# Patient Record
Sex: Female | Born: 1970 | Race: White | Hispanic: No | Marital: Married | Smoking: Never smoker
Health system: Southern US, Community
[De-identification: ages and names within clinical notes are randomized; demographics above are authoritative.]

## PROBLEM LIST (undated history)

## (undated) DIAGNOSIS — E079 Disorder of thyroid, unspecified: Secondary | ICD-10-CM

## (undated) DIAGNOSIS — F329 Major depressive disorder, single episode, unspecified: Secondary | ICD-10-CM

## (undated) DIAGNOSIS — G43909 Migraine, unspecified, not intractable, without status migrainosus: Secondary | ICD-10-CM

## (undated) DIAGNOSIS — K219 Gastro-esophageal reflux disease without esophagitis: Secondary | ICD-10-CM

## (undated) DIAGNOSIS — L219 Seborrheic dermatitis, unspecified: Secondary | ICD-10-CM

## (undated) DIAGNOSIS — F32A Depression, unspecified: Secondary | ICD-10-CM

## (undated) DIAGNOSIS — E785 Hyperlipidemia, unspecified: Secondary | ICD-10-CM

## (undated) DIAGNOSIS — T7840XA Allergy, unspecified, initial encounter: Secondary | ICD-10-CM

## (undated) DIAGNOSIS — F419 Anxiety disorder, unspecified: Secondary | ICD-10-CM

## (undated) DIAGNOSIS — M1990 Unspecified osteoarthritis, unspecified site: Secondary | ICD-10-CM

## (undated) HISTORY — DX: Allergy, unspecified, initial encounter: T78.40XA

## (undated) HISTORY — DX: Anxiety disorder, unspecified: F41.9

## (undated) HISTORY — DX: Unspecified osteoarthritis, unspecified site: M19.90

## (undated) HISTORY — DX: Depression, unspecified: F32.A

## (undated) HISTORY — DX: Hyperlipidemia, unspecified: E78.5

## (undated) HISTORY — DX: Disorder of thyroid, unspecified: E07.9

## (undated) HISTORY — DX: Gastro-esophageal reflux disease without esophagitis: K21.9

## (undated) HISTORY — DX: Major depressive disorder, single episode, unspecified: F32.9

## (undated) HISTORY — DX: Seborrheic dermatitis, unspecified: L21.9

## (undated) HISTORY — DX: Migraine, unspecified, not intractable, without status migrainosus: G43.909

---

## 1988-02-12 HISTORY — PX: WISDOM TOOTH EXTRACTION: SHX21

## 1999-03-29 ENCOUNTER — Other Ambulatory Visit: Admission: RE | Admit: 1999-03-29 | Discharge: 1999-03-29 | Payer: Self-pay | Admitting: Obstetrics and Gynecology

## 1999-08-02 ENCOUNTER — Ambulatory Visit (HOSPITAL_COMMUNITY): Admission: RE | Admit: 1999-08-02 | Discharge: 1999-08-02 | Payer: Self-pay | Admitting: Obstetrics and Gynecology

## 1999-10-31 ENCOUNTER — Ambulatory Visit (HOSPITAL_COMMUNITY): Admission: RE | Admit: 1999-10-31 | Discharge: 1999-10-31 | Payer: Self-pay | Admitting: Obstetrics and Gynecology

## 1999-10-31 ENCOUNTER — Encounter: Payer: Self-pay | Admitting: Obstetrics and Gynecology

## 1999-11-05 ENCOUNTER — Inpatient Hospital Stay (HOSPITAL_COMMUNITY): Admission: AD | Admit: 1999-11-05 | Discharge: 1999-11-08 | Payer: Self-pay | Admitting: Obstetrics and Gynecology

## 1999-11-09 ENCOUNTER — Encounter: Admission: RE | Admit: 1999-11-09 | Discharge: 1999-12-25 | Payer: Self-pay | Admitting: Obstetrics and Gynecology

## 1999-12-13 ENCOUNTER — Other Ambulatory Visit: Admission: RE | Admit: 1999-12-13 | Discharge: 1999-12-13 | Payer: Self-pay | Admitting: Obstetrics and Gynecology

## 2001-02-09 ENCOUNTER — Other Ambulatory Visit: Admission: RE | Admit: 2001-02-09 | Discharge: 2001-02-09 | Payer: Self-pay | Admitting: Internal Medicine

## 2001-02-27 ENCOUNTER — Other Ambulatory Visit: Admission: RE | Admit: 2001-02-27 | Discharge: 2001-02-27 | Payer: Self-pay | Admitting: Internal Medicine

## 2001-03-03 ENCOUNTER — Other Ambulatory Visit: Admission: RE | Admit: 2001-03-03 | Discharge: 2001-03-03 | Payer: Self-pay | Admitting: Obstetrics and Gynecology

## 2001-09-29 ENCOUNTER — Other Ambulatory Visit: Admission: RE | Admit: 2001-09-29 | Discharge: 2001-09-29 | Payer: Self-pay | Admitting: Obstetrics and Gynecology

## 2001-12-08 ENCOUNTER — Other Ambulatory Visit: Admission: RE | Admit: 2001-12-08 | Discharge: 2001-12-08 | Payer: Self-pay | Admitting: Obstetrics and Gynecology

## 2002-03-30 ENCOUNTER — Ambulatory Visit (HOSPITAL_COMMUNITY): Admission: RE | Admit: 2002-03-30 | Discharge: 2002-03-30 | Payer: Self-pay | Admitting: Obstetrics and Gynecology

## 2002-03-30 ENCOUNTER — Other Ambulatory Visit: Admission: RE | Admit: 2002-03-30 | Discharge: 2002-03-30 | Payer: Self-pay | Admitting: Obstetrics and Gynecology

## 2002-06-17 ENCOUNTER — Inpatient Hospital Stay (HOSPITAL_COMMUNITY): Admission: AD | Admit: 2002-06-17 | Discharge: 2002-06-20 | Payer: Self-pay | Admitting: Obstetrics and Gynecology

## 2002-07-29 ENCOUNTER — Other Ambulatory Visit: Admission: RE | Admit: 2002-07-29 | Discharge: 2002-07-29 | Payer: Self-pay | Admitting: Obstetrics and Gynecology

## 2003-02-21 ENCOUNTER — Other Ambulatory Visit: Admission: RE | Admit: 2003-02-21 | Discharge: 2003-02-21 | Payer: Self-pay | Admitting: Obstetrics and Gynecology

## 2003-08-23 ENCOUNTER — Other Ambulatory Visit: Admission: RE | Admit: 2003-08-23 | Discharge: 2003-08-23 | Payer: Self-pay | Admitting: Obstetrics and Gynecology

## 2004-03-09 ENCOUNTER — Encounter: Admission: RE | Admit: 2004-03-09 | Discharge: 2004-03-09 | Payer: Self-pay | Admitting: Internal Medicine

## 2004-09-19 ENCOUNTER — Other Ambulatory Visit: Admission: RE | Admit: 2004-09-19 | Discharge: 2004-09-19 | Payer: Self-pay | Admitting: Obstetrics and Gynecology

## 2005-02-11 HISTORY — PX: ORIF PATELLA FRACTURE: SUR947

## 2006-02-11 HISTORY — PX: ORIF PATELLA FRACTURE: SUR947

## 2006-03-04 ENCOUNTER — Emergency Department (HOSPITAL_COMMUNITY): Admission: EM | Admit: 2006-03-04 | Discharge: 2006-03-04 | Payer: Self-pay | Admitting: Emergency Medicine

## 2006-03-06 ENCOUNTER — Inpatient Hospital Stay (HOSPITAL_COMMUNITY): Admission: AD | Admit: 2006-03-06 | Discharge: 2006-03-07 | Payer: Self-pay | Admitting: Orthopedic Surgery

## 2006-04-25 ENCOUNTER — Encounter: Admission: RE | Admit: 2006-04-25 | Discharge: 2006-07-24 | Payer: Self-pay | Admitting: Orthopedic Surgery

## 2006-10-01 ENCOUNTER — Ambulatory Visit (HOSPITAL_BASED_OUTPATIENT_CLINIC_OR_DEPARTMENT_OTHER): Admission: RE | Admit: 2006-10-01 | Discharge: 2006-10-01 | Payer: Self-pay | Admitting: Orthopedic Surgery

## 2006-12-11 ENCOUNTER — Ambulatory Visit: Payer: Self-pay | Admitting: Endocrinology

## 2006-12-11 ENCOUNTER — Encounter: Payer: Self-pay | Admitting: Endocrinology

## 2006-12-11 DIAGNOSIS — E039 Hypothyroidism, unspecified: Secondary | ICD-10-CM | POA: Insufficient documentation

## 2006-12-11 DIAGNOSIS — E785 Hyperlipidemia, unspecified: Secondary | ICD-10-CM | POA: Insufficient documentation

## 2006-12-11 DIAGNOSIS — R51 Headache: Secondary | ICD-10-CM | POA: Insufficient documentation

## 2006-12-11 DIAGNOSIS — R519 Headache, unspecified: Secondary | ICD-10-CM | POA: Insufficient documentation

## 2006-12-12 ENCOUNTER — Telehealth: Payer: Self-pay | Admitting: Endocrinology

## 2006-12-18 ENCOUNTER — Encounter: Admission: RE | Admit: 2006-12-18 | Discharge: 2006-12-18 | Payer: Self-pay | Admitting: Endocrinology

## 2007-01-12 ENCOUNTER — Encounter: Payer: Self-pay | Admitting: Endocrinology

## 2007-02-26 ENCOUNTER — Ambulatory Visit: Payer: Self-pay | Admitting: Endocrinology

## 2007-03-05 LAB — CONVERTED CEMR LAB: TSH: 2.17 microintl units/mL (ref 0.35–5.50)

## 2007-12-14 ENCOUNTER — Telehealth (INDEPENDENT_AMBULATORY_CARE_PROVIDER_SITE_OTHER): Payer: Self-pay | Admitting: *Deleted

## 2007-12-29 ENCOUNTER — Ambulatory Visit: Payer: Self-pay | Admitting: Endocrinology

## 2007-12-29 DIAGNOSIS — E079 Disorder of thyroid, unspecified: Secondary | ICD-10-CM | POA: Insufficient documentation

## 2007-12-29 LAB — CONVERTED CEMR LAB: TSH: 3.34 microintl units/mL (ref 0.35–5.50)

## 2008-01-05 ENCOUNTER — Ambulatory Visit (HOSPITAL_COMMUNITY): Admission: RE | Admit: 2008-01-05 | Discharge: 2008-01-05 | Payer: Self-pay | Admitting: Endocrinology

## 2008-01-11 ENCOUNTER — Ambulatory Visit: Payer: Self-pay | Admitting: Internal Medicine

## 2008-12-12 LAB — CONVERTED CEMR LAB

## 2009-01-16 ENCOUNTER — Ambulatory Visit: Payer: Self-pay | Admitting: Internal Medicine

## 2009-01-16 ENCOUNTER — Encounter: Payer: Self-pay | Admitting: Endocrinology

## 2009-01-27 ENCOUNTER — Ambulatory Visit: Payer: Self-pay | Admitting: Endocrinology

## 2009-02-16 ENCOUNTER — Ambulatory Visit: Payer: Self-pay | Admitting: Internal Medicine

## 2009-08-11 ENCOUNTER — Ambulatory Visit: Payer: Self-pay | Admitting: Internal Medicine

## 2010-03-04 ENCOUNTER — Encounter: Payer: Self-pay | Admitting: Endocrinology

## 2010-04-02 ENCOUNTER — Encounter (INDEPENDENT_AMBULATORY_CARE_PROVIDER_SITE_OTHER): Payer: Self-pay | Admitting: Internal Medicine

## 2010-04-02 DIAGNOSIS — L219 Seborrheic dermatitis, unspecified: Secondary | ICD-10-CM

## 2010-04-02 DIAGNOSIS — E039 Hypothyroidism, unspecified: Secondary | ICD-10-CM

## 2010-04-02 DIAGNOSIS — G43909 Migraine, unspecified, not intractable, without status migrainosus: Secondary | ICD-10-CM

## 2010-05-30 ENCOUNTER — Other Ambulatory Visit: Payer: Self-pay | Admitting: Endocrinology

## 2010-06-26 NOTE — Op Note (Signed)
NAMEMADESYN, AST              ACCOUNT NO.:  [id number]   MEDICAL RECORD NO.:  [id number]          PATIENT TYPE:  AMB   LOCATION:  [facility]                         FACILITY:  [facility]   PHYSICIAN:  Ollen Gross, M.D.    DATE OF BIRTH:  10/29/1970   DATE OF PROCEDURE:  10/01/2006  DATE OF DISCHARGE:                               OPERATIVE REPORT   PREOPERATIVE DIAGNOSIS:  Painful hardware right patella.   POSTOPERATIVE DIAGNOSIS:  Painful hardware right patella.   PROCEDURE:  Hardware removal right patella.   SURGEON:  Ollen Gross, M.D.   ASSISTANT:  None.   ANESTHESIA:  General.   ESTIMATED BLOOD LOSS:  Minimal.   DRAINS:  None.   TOURNIQUET TIME:  11 minutes at 300 mmHg.   COMPLICATIONS:  None.   CONDITION:  Stable to recovery.   BRIEF CLINICAL NOTE:  [name] is a 40 year old female who underwent open  reduction and internal fixation of a left patellar fracture  approximately eight months ago.  She has had pain from the hardware and  the fracture has healed uneventfully.  She presents now for hardware  removal.   PROCEDURE IN DETAIL:  After successful administration of general  anesthetic, a tourniquet was placed high on the left thigh.  The left  lower extremity was prepped and draped in the usual sterile fashion.  The extremity was elevated and tourniquet inflated to 300 mmHg.  I  excised her previous scar and got down to the patella.  The two K-wires  were identified and removed.  I then identified the cerclage wire,  clipped it with a wire cutter, and removed it with pliers.  This  represented all of the hardware that had been in place and this was all  removed.  The wound was then copiously irrigated with saline solution  and tourniquet released for a total time of 11 minutes.  Minor bleeding  stopped with cautery.  Subcu was closed with interrupted 2-0 Vicryl and  subcuticular running 4-0 Monocryl.  The incision was cleaned and dried  and Steri-Strips and a  bulky sterile dressing applied.  Ice packs were  placed and she is awakened and transferred to recovery in stable  condition.      Ollen Gross, M.D.  Electronically Signed     FA/MEDQ  D:  10/01/2006  T:  10/02/2006  Job:  [id number]

## 2010-06-29 ENCOUNTER — Other Ambulatory Visit: Payer: Self-pay | Admitting: Endocrinology

## 2010-06-29 NOTE — Op Note (Signed)
NAME:  Natalie Kelly, Natalie Kelly                        ACCOUNT NO.:  [id number]   MEDICAL RECORD NO.:  [id number]                   PATIENT TYPE:  INP   LOCATION:  9114                                 FACILITY:  [facility]   PHYSICIAN:  Juluis Mire, M.D.                DATE OF BIRTH:  24-Jan-1971   DATE OF PROCEDURE:  06/17/2002  DATE OF DISCHARGE:                                 OPERATIVE REPORT   PREOPERATIVE DIAGNOSIS:  Intrauterine pregnancy at term with prior cesarean  section, desires repeat.   POSTOPERATIVE DIAGNOSIS:  Intrauterine pregnancy at term with prior cesarean  section, desires repeat.   PROCEDURE:  Low transverse cesarean section.   SURGEON:  Juluis Mire, M.D.   ANESTHESIA:  Spinal.   ESTIMATED BLOOD LOSS:  800 mL.   PACKS AND DRAINS:  None.   BLOOD REPLACED:  None.   COMPLICATIONS:  None.   INDICATIONS:  Dictated in the history and physical.   DESCRIPTION OF PROCEDURE:  The patient was taken to the OR and placed in the  supine position with left lateral tilt.  After satisfactory level of spinal  anesthesia was obtained, the abdomen was prepped out with Betadine and  draped in a sterile field.  A prior low transverse skin incision was then  excised, incision went through skin and subcutaneous tissue.  The fascia was  entered sharply and the incision in the fascia extended laterally.  The  fascia taken off the muscle superiorly and inferiorly.  The rectus muscles  were separated in the midline.  The peritoneum was entered sharply and the  incision in the peritoneum extended superiorly and inferiorly.  A low  transverse bladder flap was developed.  A low transverse uterine incision  developed with a knife, extended laterally using manual traction.  The  infant was in the vertex position, delivered with _______ gentle fundal  pressure.  The infant was a viable female who weighed 9 pounds 1 ounce, Apgars  were 9/9.  Umbilical artery pH was 7.30.  The placenta was then  delivered  manually.  The uterus wiped clean of remaining membranes and placenta.  The  uterus was then closed with running locking suture of 0 chromic using a two-  layer closure technique.  We had good hemostasis.  Tubes and ovaries were  noted to be unremarkable.  Urine output remained clear and adequate.  At  this point in time muscles reapproximated in a running suture of 3-0 Vicryl.  Fascia closed with a running suture of 0 PDS.  Skin was closed with staples  and Steri-Strips.  Sponge, instrument, and needle count reported as correct  by the circulating nurse x2.  The Foley catheter remained clear at the time  of closure.  The patient tolerated the procedure well and was returned to  the recovery room in good condition.  Juluis Mire, M.D.   JSM/MEDQ  D:  06/17/2002  T:  06/18/2002  Job:  [id number]

## 2010-06-29 NOTE — Op Note (Signed)
Natalie Kelly, Natalie Kelly              ACCOUNT NO.:  [id number]   MEDICAL RECORD NO.:  [id number]          PATIENT TYPE:  OIB   LOCATION:  0098                         FACILITY:  [facility]   PHYSICIAN:  Ollen Gross, M.D.    DATE OF BIRTH:  01-23-1971   DATE OF PROCEDURE:  03/05/2006  DATE OF DISCHARGE:                               OPERATIVE REPORT   PREOPERATIVE DIAGNOSIS:  Right patella fracture.   POSTOPERATIVE DIAGNOSIS:  Right patella fracture.   PROCEDURE:  Open reduction internal fixation right patella fracture.   SURGEON:  Dr. Homero Fellers Aluisio.   ASSISTANT:  Avel Peace, PA-C.   ANESTHESIA:  General.   ESTIMATED BLOOD LOSS:  Minimal.   DRAINS:  None.   TOURNIQUET TIME:  38 minutes at 300 mmHg.   COMPLICATIONS:  None.   CONDITION:  Stable to recovery.   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  She had dysfunctional  extensor mechanism and presents now for open reduction internal  fixation.   PROCEDURE IN DETAIL:  After successful administration of general  anesthetic, a tourniquet was placed high on the right thigh and right  lower extremity prepped and draped in the usual sterile fashion.  Extremities wrapped in Esmarch, tourniquet inflated to 300 mmHg.  A  midline incision was made with a 10 blade through the subcutaneous  tissue.  The superficial retinaculum was incised and elevated medial and  lateral.  The fracture was identified.  The joint is copiously irrigated  with saline solution to remove hematoma.  The superior fragment is one  main fragment while the inferior fragment is two main fragments and some  small comminuted pieces.  The very small comminuted pieces were debrided  out.  The configuration is a transverse fracture with the superior  fragment representing about two-thirds of the patella and the inferior  fragments about one-third of the patella.  We reduced this with towel  clips and then passed the 0.062 K-wire, one from superior to inferior,  the other from inferior to superior in order to capture the main  fragments and this effectively did so.  I then placed a 16-gauge  Angiocath distally along the inferior pole of the patella from lateral  to medial, passed an 18 gauge wire through that and then superiorly did  a similar passing with the Angiocath, put the wire through that in a  figure-of-eight fashion and tightened the wire to effectively compress  the superior to inferior fragment.  This effectively reduced the main  fragments anatomically.  There is still one decent size fragment about 5  mm x 1 cm inferolateral and I reattached that to the main fragment with  #2 FiberWire sutures through drill holes in the main fragment and this  anatomically reduced this with stable fixation. I then flexed the knee  and had flexion against gravity of 135 degrees and nothing was pulling  apart.  Everything remained well reduced.  X-rays were taken in  the AP  and lateral plane showing good position of the hardware and that the  fracture had lined up essentially anatomically with the exception of  some of the missing fragments from the comminution. I then thoroughly  irrigated the knee joint and fixed the tears in the medial and lateral  retinaculum with the FiberWire suture.  We flexed against gravity again  and were able to obtain 135 degrees without anything pulling apart.  We  then cut the wires at the appropriate length and then twisted them into  the soft tissue.  The subcutaneous tissues were then closed with  interrupted 2-0 Vicryl and the tourniquet released with a total time of  38 minutes.  The subcuticular was closed with running 4-0 Monocryl.  We  then placed the catheter for the Marcaine pain pump subcutaneously and  initiated the pump.  Steri-Strips and a bulky sterile dressing were  then  placed and she was awakened and transported to recovery in stable  condition.      Ollen Gross, M.D.  Electronically Signed     FA/MEDQ  D:  03/05/2006  T:  03/05/2006  Job:  [id number]

## 2010-06-29 NOTE — H&P (Signed)
[facility]  Patient:    Natalie Kelly, Natalie Kelly                       MRN: [id number] Adm. Date:  11/05/99 Attending:  Juluis Mire, M.D.                         History and Physical  HISTORY OF PRESENT ILLNESS:   The patient is a 40 year old primigravida, married white female, last menstrual period of December 6, giving her estimated date of confinement of September 11, and estimated gestational age of [redacted] weeks.  Her prenatal course has been complicated by postdates.  An ultrasound had revealed an estimated fetal weight of approximately 10 pounds. Due to large estimated fetal weight and postdates with unfavorable cervix, tpx is admitted for primary cesarean section.  The option of a trial of labor was given to the patient which she declines to the large estimated fetal weight. Dates are consistent with early ultrasounds and initial examination.  Her prenatal course has otherwise been uncomplicated.  Her three-hour glucose tolerance test was 134, her Group B Strep was negative.  ALLERGIES:                    No known drug allergies.  MEDICATIONS:                  Prenatal vitamins.  PAST MEDICAL HISTORY: FAMILY HISTORY: SOCIAL HISTORY:               Please see prenatal records.  REVIEW OF SYSTEMS:            Noncontributory.  PHYSICAL EXAMINATION:  VITAL SIGNS:                  The patient is afebrile with stable vital signs.   HEENT:                        The patient is normocephalic.  Pupils are equal, round and reactive to light and accommodation.  Extraocular muscles were intact.  Sclerae and conjunctivae clear.  Oropharynx clear.  NECK:                         Without thyromegaly.  BREASTS:                      Glandular, but no discrete masses.  LUNGS:                        Clear.  HEART:                        Regular rate and rhythm with a grade 2/6 systolic ejection murmur, no clicks or gallops.  ABDOMEN:                       Gravid uterus consistent with dates.  PELVIC:                       Cervix is long and closed.  EXTREMITIES:                  Trace edema.  NEUROLOGICAL:                 Grossly within normal limits.  IMPRESSION:                   Intrauterine pregnancy at 41 weeks with macrosomic infact.  PLAN:                         The patient was offered the option of trial of labor in the form of induction versus primary cesarean section.  Due to the large estimated fetal weight and unfavorable cervix, the patient decided to proceed with primary cesarean section.  The risks were discussed including the risks of anesthesia, the risks of infection, the risks of hemorrhage that could necessitate transfusion with the risks of AIDS or hepatitis, the risks of injury to adjacent organs including bladder, bowel, or ureters that could require further exploratory surgery, the risks of deep venous thrombosis and pulmonary embolus.  The patient expressed understanding of indications and risks. DD:  11/05/99 TD:  11/05/99 Job: [id number]

## 2010-06-29 NOTE — Discharge Summary (Signed)
[facility]  Patient:    Natalie Kelly, Natalie Kelly                     MRN: [id number] Adm. Date:  11/05/99 Disc. Date: 11/08/99 Attending:  Frederich Balding Dictator:   Danie Chandler, R.N.                           Discharge Summary  ADMISSION DIAGNOSIS:          Intrauterine pregnancy at term with macrosomic infant.  DISCHARGE DIAGNOSIS:          Intrauterine pregnancy at term with macrosomic infant.  PROCEDURE:                    On November 05, 1999, a primary low transverse cesarean section was performed.  REASON FOR ADMISSION:         Please see dictated history and physical.  HOSPITAL COURSE:              The patient was taken to the operating room and underwent the above noted procedure without complications.  This was productive of a viable female infant with Apgars of 8 at one minute and 9 at five minutes.  Postoperatively, the patient did well.  On postoperative day #1, the patients hemoglobin was 9.4, hematocrit 27.9 and white blood cell count 15.6.  The patient had good control of pain on this day.  She was started on iron daily and had a repeat CBC ordered for the following day.  On postoperatively day #2, the patient was ambulating well without difficulty and had good return of bowel function.  She was tolerating a regular diet.  Her hemoglobin was stable at 9.6.  She was discharged to home on postoperative day #3.  CONDITION ON DISCHARGE:       Good.  DIET:                         Regular as tolerated.  ACTIVITY:                     No heavy lifting, no driving, no vaginal entry.  FOLLOW-UP:                    She is to follow up in the office in one to two weeks for incision check and she is to call for temperature greater than 100 degrees, persistent nausea or vomiting, heavy vaginal bleeding and/or redness or drainage from the incision site.  DISCHARGE MEDICATIONS:        1. Prenatal vitamin one p.o. q.d.               2. Percocet one to two p.o. q.4h. p.r.n. pain. DD:  11/08/99 TD:  11/08/99 Job: [id number]

## 2010-06-29 NOTE — Discharge Summary (Signed)
NAME:  Natalie Kelly, Natalie Kelly                        ACCOUNT NO.:  [id number]   MEDICAL RECORD NO.:  [id number]                   PATIENT TYPE:  INP   LOCATION:  9114                                 FACILITY:  [facility]   PHYSICIAN:  Dineen Kid. Rana Snare, M.D.                 DATE OF BIRTH:  [birth year]   DATE OF ADMISSION:  06/17/2002  DATE OF DISCHARGE:  06/20/2002                                 DISCHARGE SUMMARY   ADMISSION DIAGNOSES:  1. Intrauterine pregnancy at term.  2. Previous cesarean delivery, desires repeat.   DISCHARGE DIAGNOSES:  1. Status post low transverse cesarean section.  2. Viable female infant.   PROCEDURE:  Repeat low transverse cesarean section.   REASON FOR ADMISSION:  Please see dictated H&P.   HOSPITAL COURSE:  The patient was a 40 year old gravida 2, para 1 that was  admitted to hospital at 81 weeks estimated gestational age for scheduled  cesarean delivery.  The patient had had a previous cesarean delivery for a  large for gestational age baby and she desired repeat  cesarean delivery.  Her pregnancy had been otherwise uncomplicated with a normal three-hour  glucose tolerance test.  On the a.m. of admission, the patient was prepped  accordingly and taken to the operating room where spinal anesthesia was  administered without difficulty.  A low transverse incision was made with  delivery of a viable female infant weighing 9 pounds 1 ounce with Apgars of 9  at one minute and 9 at five minutes.  Umbilical cord pH was 7.30.  The  patient tolerated procedure well and was taken to the recovery room in  stable condition.   On postoperative day #1, the patient had good return of bowel function.  Vital signs were stable.  The patient was afebrile.  Fundus was firm and  nontender.  Abdominal dressing was clean, dry and intact.   Labs reveal hemoglobin 9.5, platelet count 246,000, WBC 14.5.  RhoGAM  studies were pending.  Diet was advanced and abdominal  dressing was   removed.   On postoperative #2, vital signs were stable.  Abdomen was soft and  nontender.  Fundus was firm and incision was clean, dry and intact.  The  patient was ambulating well and tolerating a regular diet without complaint  of nausea and vomiting.   On postoperative day #3, the patient was doing well.  Vital signs were  stable.  She remained afebrile.  Fundus was firm and nontender.  Incision  was clean, dry and intact.  Staples removed and the patient was discharged  home.   CONDITION ON DISCHARGE:  Good.   DIET:  Regular as tolerated.   ACTIVITY:  No heavy lifting.  No driving x2 weeks. No vaginal entry.   FOLLOW UP:  The patient is to follow up in the office in one to two weeks  for an incision check.  She is to  call for temperature greater than 100  degrees, persistent nausea and vomiting, heavy vaginal bleeding, any redness  or drainage in incisional site.   DISCHARGE MEDICATIONS:  1. Tylox #30 one p.o. every four to six hours p.r.n.  2. Motrin 600 mg every six hours p.r.n.  3. Prenatal vitamins one p.o. daily.  4. Colace one p.o. daily p.r.n.       Julio Sicks, N.P.                        Dineen Kid Rana Snare, M.D.    CC/MEDQ  D:  07/20/2002  T:  07/20/2002  Job:  [id number]

## 2010-06-29 NOTE — H&P (Signed)
NAME:  Natalie Kelly, Natalie Kelly                        ACCOUNT NO.:  [id number]   MEDICAL RECORD NO.:  [id number]                   PATIENT TYPE:  INP   LOCATION:  NA                                   FACILITY:  [facility]   PHYSICIAN:  Juluis Mire, M.D.                DATE OF BIRTH:  1970-05-26   DATE OF ADMISSION:  DATE OF DISCHARGE:                                HISTORY & PHYSICAL   HISTORY OF PRESENT ILLNESS:  Patient is a 40 year old gravida 2, para 1,  married female, last menstrual period of September 16, 2001, giving an estimated  date of confinement of Jun 23, 2002; this gives her an estimated gestational  age of [redacted] weeks, consistent with initial exam and prior ultrasound.  Patient  presents for repeat C-section.   Prenatal course was complicated by a prior low-transverse cesarean section  due to large estimated fetal weight.  The option of a trial labor had been  discussed but declined by the patient.  The patient now presents for repeat  cesarean section.  Her prenatal course has otherwise been uncomplicated.   PRENATAL LABORATORIES:  She is O negative with a negative antibody screen,  nonreactive serology, positive Rubella titer.  Her hepatitis B surface  antigen was negative.  Her quad screen was also negative.  Group B strep was  negative.  Her 50-gram Glucola was elevated; however, a three-hour glucose  tolerance test was within normal limits.   PAST MEDICAL HISTORY, FAMILY HISTORY, AND SOCIAL HISTORY:  Please see  prenatal records.   REVIEW OF SYSTEMS:  Noncontributory.   PHYSICAL EXAMINATION:  VITAL SIGNS:  Patient is afebrile with stable vital  signs.  HEENT EXAM:  Patient is normocephalic.  Pupils are equal, round, and  reactive to light and accommodation.  Extraocular movements are intact.  Sclerae and conjunctivae are clear.  Oropharynx is clear.  NECK:  Without thyromegaly.  BREASTS:  Glandular, but no discrete masses.  LUNGS:  Clear.  CARDIAC SYSTEM:  Regular rhythm  and rate with a grade 2/6 systolic ejection  murmur.  No clicks or gallops.  ABDOMINAL EXAM:  Revealed gravid uterus consistent with dates.  PELVIC EXAM:  Cervix is long and closed.  Vertex presenting.  EXTREMITIES:  Trace edema.  NEUROLOGICAL:  Deep tendon reflexes 2+ and no clonus.   IMPRESSION:  Intrauterine pregnancy at 39 weeks with prior cesarean section,  desires repeat.   PLAN:  The patient is to undergo a repeat cesarean section.  The risks of  surgery have been discussed, including the risk of infection, the risk of  hemorrhage that could necessitate transfusion with the risk of AIDS or  hepatitis, the risk of injury to adjacent organs, including bladder, bowel,  or ureters that could require further exploratory surgery, the risk of deep  venous thrombosis and pulmonary embolus.  Patient expressed understanding of  indications and risks.  Juluis Mire, M.D.    JSM/MEDQ  D:  06/17/2002  T:  06/17/2002  Job:  [id number]

## 2010-06-29 NOTE — Op Note (Signed)
[facility]  Patient:    Natalie Kelly, Natalie Kelly                     MRN: [id number] Proc. Date: 11/05/99 Attending:  Frederich Balding                           Operative Report  PREOPERATIVE DIAGNOSIS:       Intrauterine pregnancy at term with macrosomic                               infant.  POSTOPERATIVE DIAGNOSIS:      Intrauterine pregnancy at term with macrosomic                               infant.  OPERATION:                    Low transverse cesarean section.  SURGEON:                      Juluis Mire, M.D.  ANESTHESIA:                   Spinal anesthesia.  ESTIMATED BLOOD LOSS:         800 cc.  PACKS AND DRAINS:             None.  INTRAOPERATIVE BLOOD:         None.  COMPLICATIONS:                None.  INDICATIONS:                  This is dictated in the history and physical.  DESCRIPTION OF PROCEDURE:     The patient was taken to the OR and placed in the supine position with left lateral tilt. After a satisfactory level of spinal anesthesia was obtained, the abdomen was prepped out with Betadine and draped as a sterile field.  A low transverse skin incision was made with the knife and carried through subcutaneous tissue.  The anterior rectus fascia was entered sharply and extended into the fascia laterally.  The fascia was taken off the muscle superiorly and inferiorly.  Rectus muscles then separated in the midline.  The peritoneum was then entered sharply and the incision in the peritoneum was extended both superiorly and inferiorly.  Low transverse bladder flap was developed. A low transverse uterine incision was begun with the knife and extended laterally using manual traction.  The infant presented in vertex presentation, delivered with elevation of the head and fundal pressure.  The infant was a viable female who weighed 9 pounds 12 ounces.  Apgars were 8 and 9.  Umbilicus cord pH was 7.31.  Placenta was then delivered  manually.  Uterus wiped free of remaining membranes and placenta. The uterus was closed with running locking sutures of 0 chromic using a two-layer closure technique with good hemostasis.  Urine output remained clear and adequate. Tubes and ovaries were visualized and noted to be unremarkable. Muscles were reapproximated with running sutures of 3-0 Vicryl. Fascia closed with running sutures of 0 PDS.  Skin was closed with staples and Steri-Strips. Sponge, needle and instrument counts reported as correct by the circulating nurse x 2.  The Foley catheter remained clear at  the time of closure.  The patient tolerated the procedure well and was returned to the recovery room in good condition. DD:  11/05/99 TD:  11/06/99 Job: [id number]

## 2010-11-23 LAB — POCT PREGNANCY, URINE: Operator id: 268271

## 2010-11-23 LAB — POCT HEMOGLOBIN-HEMACUE: Hemoglobin: 13.3

## 2011-01-02 ENCOUNTER — Other Ambulatory Visit: Payer: Self-pay | Admitting: Internal Medicine

## 2011-01-02 NOTE — Telephone Encounter (Signed)
See when pt due for OV please.Have refilled Rx. MJB

## 2011-01-31 ENCOUNTER — Other Ambulatory Visit: Payer: Self-pay | Admitting: Internal Medicine

## 2011-01-31 MED ORDER — LEVOTHYROXINE SODIUM 50 MCG PO TABS: 50.0000 ug | ORAL_TABLET | Freq: Every day | ORAL | Status: DC

## 2011-03-11 ENCOUNTER — Encounter: Payer: Commercial Managed Care - PPO | Admitting: Internal Medicine

## 2011-04-05 ENCOUNTER — Other Ambulatory Visit: Payer: Commercial Managed Care - PPO | Admitting: Internal Medicine

## 2011-04-05 DIAGNOSIS — Z0000 Encounter for general adult medical examination without abnormal findings: Secondary | ICD-10-CM

## 2011-04-05 LAB — COMPREHENSIVE METABOLIC PANEL
ALT: 9 U/L (ref 0–35)
Albumin: 4.6 g/dL (ref 3.5–5.2)
BUN: 13 mg/dL (ref 6–23)
CO2: 19 mEq/L (ref 19–32)
Creat: 0.84 mg/dL (ref 0.50–1.10)
Total Bilirubin: 0.5 mg/dL (ref 0.3–1.2)
Total Protein: 7.5 g/dL (ref 6.0–8.3)

## 2011-04-05 LAB — LIPID PANEL
Cholesterol: 208 mg/dL — ABNORMAL HIGH (ref 0–200)
LDL Cholesterol: 131 mg/dL — ABNORMAL HIGH (ref 0–99)
Triglycerides: 124 mg/dL (ref <150)

## 2011-04-06 LAB — TSH: TSH: 4.68 u[IU]/mL — ABNORMAL HIGH (ref 0.350–4.500)

## 2011-04-06 LAB — CBC WITH DIFFERENTIAL/PLATELET
Basophils Absolute: 0.1 10*3/uL (ref 0.0–0.1)
Basophils Relative: 1 % (ref 0–1)
Eosinophils Absolute: 0.1 10*3/uL (ref 0.0–0.7)
HCT: 42.5 % (ref 36.0–46.0)
Lymphs Abs: 3.2 10*3/uL (ref 0.7–4.0)
MCHC: 32.5 g/dL (ref 30.0–36.0)
MCV: 87.8 fL (ref 78.0–100.0)
Platelets: 354 10*3/uL (ref 150–400)
RBC: 4.84 MIL/uL (ref 3.87–5.11)
RDW: 13.3 % (ref 11.5–15.5)
WBC: 9.8 10*3/uL (ref 4.0–10.5)

## 2011-04-07 ENCOUNTER — Other Ambulatory Visit: Payer: Self-pay | Admitting: Internal Medicine

## 2011-04-08 ENCOUNTER — Encounter: Payer: Self-pay | Admitting: Internal Medicine

## 2011-04-08 ENCOUNTER — Ambulatory Visit (INDEPENDENT_AMBULATORY_CARE_PROVIDER_SITE_OTHER): Payer: 59 | Admitting: Internal Medicine

## 2011-04-08 VITALS — BP 120/78 | HR 76 | Temp 98.9°F | Ht 63.5 in | Wt 153.5 lb

## 2011-04-08 DIAGNOSIS — Z0000 Encounter for general adult medical examination without abnormal findings: Secondary | ICD-10-CM

## 2011-04-08 DIAGNOSIS — G43909 Migraine, unspecified, not intractable, without status migrainosus: Secondary | ICD-10-CM | POA: Insufficient documentation

## 2011-04-08 DIAGNOSIS — E079 Disorder of thyroid, unspecified: Secondary | ICD-10-CM | POA: Insufficient documentation

## 2011-04-08 LAB — POCT URINALYSIS DIPSTICK
Bilirubin, UA: NEGATIVE
Ketones, UA: NEGATIVE
Leukocytes, UA: NEGATIVE
Nitrite, UA: NEGATIVE
Spec Grav, UA: >=1.03
Urobilinogen, UA: NEGATIVE

## 2011-04-09 ENCOUNTER — Encounter: Payer: Self-pay | Admitting: Internal Medicine

## 2011-04-09 NOTE — Patient Instructions (Addendum)
Increase Synthroid to 0.075 mg daily. Watch diet and try to get some exercise. Return for TSH in about a weeks.

## 2011-04-09 NOTE — Progress Notes (Signed)
  Subjective:    Patient ID: Natalie Kelly, female    DOB: [birth year], 41 y.o.   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 Patient had influenza immunization at work October 2012. For migraine headaches takes Topamax 50 mg daily for prophylaxis, uses Imitrex nasal spray 20 mg as needed for migraine and for rescue medication uses Fioricet if necessary. Hyperlipidemia was diagnosed in 2010. She tries to control it with diet. Dr. Arelia Sneddon is GYN physician. History of fracture right patella 2008. No known drug allergies. Currently on Synthroid 0.05 mg with compliance but TSH is elevated so were going to increase Synthroid to 0.075 mg daily and recheck in 6-8 weeks. Back in 2011 I tried to get her to take Zocor for hyperlipidemia but she did not want to do that.  Patient does not smoke or consume alcohol. She is married with children.  Father with history of stroke, high blood pressure and headaches. Mother with history of headaches. One brother with history of headaches.    Review of Systems  Constitutional: Negative.   HENT: Negative.   Eyes: Negative.   Respiratory: Negative.   Cardiovascular: Negative.   Gastrointestinal: Negative.   Genitourinary: Negative.   Musculoskeletal: Negative.   Neurological:       Sometimes 2-3 migraines per mom  Hematological: Negative.   Psychiatric/Behavioral: Negative.        Objective:   Physical Exam  Nursing note and vitals reviewed. Constitutional: She is oriented to person, place, and time. She appears well-developed and well-nourished.  HENT:  Head: Normocephalic and atraumatic.  Left Ear: External ear normal.  Nose: Nose normal.  Mouth/Throat: Oropharynx is clear and moist.  Eyes: Conjunctivae and EOM are normal. Right eye exhibits no discharge. Left eye exhibits no discharge. No scleral icterus.  Neck: No JVD present. No thyromegaly present.    Pulmonary/Chest: Effort normal and breath sounds normal. She has no wheezes. She has no rales.  Abdominal: Soft. Bowel sounds are normal. She exhibits no mass. There is no tenderness. There is no rebound.  Genitourinary:       Deferred to GYN  Musculoskeletal: She exhibits no edema.  Lymphadenopathy:    She has no cervical adenopathy.  Neurological: She is alert and oriented to person, place, and time. She has normal reflexes. No cranial nerve deficit. Coordination normal.  Skin: Skin is warm and dry. No rash noted. She is not diaphoretic.  Psychiatric: She has a normal mood and affect. Her behavior is normal. Judgment and thought content normal.          Assessment & Plan:  Migraine headaches  Hyperlipidemia  Hypothyroidism  Plan increase Synthroid to 0.075 mg daily and recheck TSH . Patient does not want to be on statin therapy at this point in time. Continue Topamax, Imitrex nasal spray and when necessary Fioricet for migraine treatment which all were refilled today.

## 2011-04-18 ENCOUNTER — Encounter: Payer: Self-pay | Admitting: Internal Medicine

## 2011-04-18 ENCOUNTER — Ambulatory Visit (INDEPENDENT_AMBULATORY_CARE_PROVIDER_SITE_OTHER): Payer: 59 | Admitting: Internal Medicine

## 2011-04-18 ENCOUNTER — Telehealth: Payer: Self-pay | Admitting: Internal Medicine

## 2011-04-18 VITALS — BP 130/84 | HR 82 | Temp 99.5°F | Wt 156.0 lb

## 2011-04-18 DIAGNOSIS — G5600 Carpal tunnel syndrome, unspecified upper limb: Secondary | ICD-10-CM

## 2011-04-18 NOTE — Telephone Encounter (Signed)
error 

## 2011-04-19 ENCOUNTER — Telehealth: Payer: Self-pay

## 2011-04-19 NOTE — Telephone Encounter (Signed)
Patient scheduled for appointment with Dr. Teressa Senter on 05/01/2011 at 9:30. Informed of this appointment.

## 2011-05-02 ENCOUNTER — Other Ambulatory Visit: Payer: Self-pay | Admitting: Internal Medicine

## 2011-05-05 NOTE — Progress Notes (Signed)
  Subjective:    Patient ID: Natalie Kelly, female    DOB: 17-Jan-1971, 41 y.o.   MRN: [id number]  HPI  41 year old white female worried about numbness in both hands. 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 Doing a lot of typing the past week or so. Patient was concerned this could be a cervical neck problem. Every other week she is to do a great deal of typing. No weakness in hands. Just numbness. Numbness seems to be in the median nerve distribution bilaterally.    Review of Systems     Objective:   Physical Exam positive Phalen's and Tinel signs bilaterally. Deep tendon reflexes 2+ and symmetrical in the upper MBs. Muscle strength is normal in the upper extremities.        Assessment & Plan:  Probable bilateral carpal tunnel syndrome.  Plan: Patient is to have nerve conduction studies. May want to wear wrist splints at night. May take over-the-counter Aleve or ibuprofen

## 2011-05-05 NOTE — Patient Instructions (Signed)
We will arrange for nerve conduction studies. Wear wrist splints at night. Take over-the-counter anti-inflammatory medication.

## 2011-06-03 ENCOUNTER — Other Ambulatory Visit: Payer: 59 | Admitting: Internal Medicine

## 2011-06-11 ENCOUNTER — Other Ambulatory Visit: Payer: 59 | Admitting: Internal Medicine

## 2011-06-11 DIAGNOSIS — E039 Hypothyroidism, unspecified: Secondary | ICD-10-CM

## 2011-06-11 LAB — TSH: TSH: 3.091 u[IU]/mL (ref 0.350–4.500)

## 2011-06-11 NOTE — Progress Notes (Signed)
Addended by: Judy Pimple on: 06/11/2011 03:43 PM   Modules accepted: Orders

## 2011-06-12 ENCOUNTER — Other Ambulatory Visit: Payer: Self-pay

## 2011-06-12 MED ORDER — LEVOTHYROXINE SODIUM 100 MCG PO TABS: 100.0000 ug | ORAL_TABLET | Freq: Every day | ORAL | Status: DC

## 2011-08-05 ENCOUNTER — Other Ambulatory Visit: Payer: 59 | Admitting: Internal Medicine

## 2011-08-05 DIAGNOSIS — E039 Hypothyroidism, unspecified: Secondary | ICD-10-CM

## 2011-08-06 ENCOUNTER — Telehealth: Payer: Self-pay | Admitting: Internal Medicine

## 2011-08-06 NOTE — Telephone Encounter (Signed)
In April we increased levothyroxine from 0.075 mg to 0.1 mg daily. Advised patient to return in 8 weeks for followup without office visit. TSH is now within normal limits on this dose of levothyroxine. Continue same dose 0.1 mg daily. Recheck in 6 months.

## 2011-09-11 ENCOUNTER — Other Ambulatory Visit: Payer: Self-pay | Admitting: Internal Medicine

## 2012-02-07 ENCOUNTER — Ambulatory Visit: Payer: 59 | Admitting: Internal Medicine

## 2012-02-17 ENCOUNTER — Encounter: Payer: Self-pay | Admitting: Internal Medicine

## 2012-02-17 ENCOUNTER — Ambulatory Visit (INDEPENDENT_AMBULATORY_CARE_PROVIDER_SITE_OTHER): Payer: 59 | Admitting: Internal Medicine

## 2012-02-17 VITALS — BP 108/78 | HR 80 | Temp 99.5°F | Wt 164.0 lb

## 2012-02-17 DIAGNOSIS — Z8669 Personal history of other diseases of the nervous system and sense organs: Secondary | ICD-10-CM

## 2012-02-17 DIAGNOSIS — E039 Hypothyroidism, unspecified: Secondary | ICD-10-CM

## 2012-02-17 NOTE — Patient Instructions (Addendum)
Continue same medications and followup with me July 2014 for physical exam

## 2012-02-17 NOTE — Addendum Note (Signed)
Addended by: Judy Pimple on: 02/17/2012 04:39 PM   Modules accepted: Orders

## 2012-02-17 NOTE — Progress Notes (Signed)
  Subjective:    Patient ID: Natalie Kelly, female    DOB: 1970-10-12, 42 y.o.   MRN: [id number]  HPI 42 year old white female with history of hypothyroidism and migraine headaches in today for six-month recheck. TSH drawn today. 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 Takes Topamax to prevent migraine headaches. If she gets one, she usually takes Excedrin Migraine and doesn't have to use Imitrex. Changes in weather can cause  migraine.    Review of Systems     Objective:   Physical Exam neck is supple without thyromegaly        Assessment & Plan:  Impression: Migraine headaches-stable on Topamax currently taking 2 x 25mg  tablets daily  Hypothyroidism-on thyroid replacement therapy consisting of Synthroid 0.1 mg daily  TSH drawn today. Originally her physical exam have been scheduled for February 2014. We will defer that until July 2014 since  she's doing well. Okay to refill Topamax and Synthroid if pharmacy calls until her physical exam July 2014

## 2012-02-18 NOTE — Progress Notes (Signed)
Patient informed. Maintain same dose of Synthroid

## 2012-02-18 NOTE — Progress Notes (Signed)
Patient informed. 

## 2012-04-10 ENCOUNTER — Other Ambulatory Visit: Payer: Self-pay | Admitting: Internal Medicine

## 2012-05-21 ENCOUNTER — Other Ambulatory Visit: Payer: Self-pay | Admitting: Obstetrics and Gynecology

## 2012-05-21 DIAGNOSIS — R928 Other abnormal and inconclusive findings on diagnostic imaging of breast: Secondary | ICD-10-CM

## 2012-06-01 ENCOUNTER — Ambulatory Visit (INDEPENDENT_AMBULATORY_CARE_PROVIDER_SITE_OTHER): Payer: 59 | Admitting: Endocrinology

## 2012-06-01 ENCOUNTER — Encounter: Payer: Self-pay | Admitting: Endocrinology

## 2012-06-01 VITALS — BP 122/74 | HR 78 | Wt 157.0 lb

## 2012-06-01 DIAGNOSIS — E079 Disorder of thyroid, unspecified: Secondary | ICD-10-CM

## 2012-06-01 NOTE — Patient Instructions (Signed)
Please continue the same thyroid medication.  Let's recheck the thyroid ultrasound.  you will receive a phone call, about a day and time for an appointment

## 2012-06-01 NOTE — Progress Notes (Signed)
Subjective:    Patient ID: Natalie Kelly, female    DOB: 1970-10-26, 42 y.o.   MRN: [id number]  HPI Pt was noted in approx 2005 to have mild hypothyroidism, in the context of the postpartum state.  She has been on synthroid since then.  US showed Inhomogeneous thyroid with multiple small hypoechoic areas scattered throughout. No measurable nodule.  She says Dr Arelia Sneddon noted slight enlargement at the anterior neck.  Pt reports no assoc pain.  She says she is not considering another pregnancy.  Past Medical History  Diagnosis Date  . Thyroid disease     hypothyroidism  . Migraine headache   . Hyperlipidemia   . Seborrhea     ear  . Anxiety   . Depression     Past Surgical History  Procedure Laterality Date  . Orif patella fracture Left 2007  . Cesarean section      History   Social History  . Marital Status: Married    Spouse Name: N/A    Number of Children: N/A  . Years of Education: N/A   Occupational History  . Not on file.   Social History Main Topics  . Smoking status: Never Smoker   . Smokeless tobacco: Never Used  . Alcohol Use: No  . Drug Use: No  . Sexually Active: Not on file   Other Topics Concern  . Not on file   Social History Narrative  . No narrative on file    Current Outpatient Prescriptions on File Prior to Visit  Medication Sig Dispense Refill  . Ascorbic Acid (VITAMIN C WITH ROSE HIPS) 1000 MG tablet Take 1,000 mg by mouth daily.      . Biotin 10 MG TABS Take by mouth.      . butalbital-acetaminophen-caffeine (FIORICET, ESGIC) 50-325-40 MG per tablet Take 1 tablet by mouth 2 (two) times daily as needed.      . fish oil-omega-3 fatty acids 1000 MG capsule Take 2 g by mouth daily.      Marland Kitchen neomycin-polymyxin-hydrocortisone (CORTISPORIN) 3.5-10000-1 otic suspension 3 drops 4 (four) times daily.      . norgestrel-ethinyl estradiol (OGESTROL) 0.5-50 MG-MCG tablet Take 1 tablet by mouth daily.      . SUMAtriptan (IMITREX) 20 MG/ACT nasal spray  Place 1 spray into the nose every 2 (two) hours as needed.      Marland Kitchen SYNTHROID 100 MCG tablet TAKE 1 TABLET BY MOUTH DAILY.  90 tablet  PRN  . topiramate (TOPAMAX) 25 MG tablet TAKE 2 TABLETS BY MOUTH DAILY  60 tablet  PRN  . vitamin E (VITAMIN E) 400 UNIT capsule Take 400 Units by mouth daily.       No current facility-administered medications on file prior to visit.    No Known Allergies  Family History  Problem Relation Age of Onset  . Stroke Father   . Hypertension Father   . Hyperlipidemia Father   . Hyperlipidemia Mother     BP 122/74  Pulse 78  Wt 157 lb (71.215 kg)  BMI 27.37 kg/m2  SpO2 98%    Review of Systems  Eyes: Negative for visual disturbance.  Respiratory: Negative for shortness of breath.   Allergic/Immunologic: Negative for environmental allergies.   Denies weight change and diplopia.  Headaches are fewer recently.     Objective:   Physical Exam VITAL SIGNS:  See vs page GENERAL: no distress NECK: The thyroid seems slightly enlarged, with an irregular surface.  No palpable lymphadenopathy at the anterior  neck.   Lab Results  Component Value Date   TSH 1.355 02/17/2012      Assessment & Plan:  Very small multinodular goiter, clinically unchanged Hypothyroidism, well-replaced

## 2012-06-03 ENCOUNTER — Ambulatory Visit
Admission: RE | Admit: 2012-06-03 | Discharge: 2012-06-03 | Disposition: A | Discharge status: Home / Self Care | Payer: 59 | Source: Ambulatory Visit | Attending: Endocrinology | Admitting: Endocrinology

## 2012-06-03 DIAGNOSIS — E079 Disorder of thyroid, unspecified: Secondary | ICD-10-CM

## 2012-06-04 ENCOUNTER — Ambulatory Visit
Admission: RE | Admit: 2012-06-04 | Discharge: 2012-06-04 | Disposition: A | Discharge status: Home / Self Care | Payer: 59 | Source: Ambulatory Visit | Attending: Obstetrics and Gynecology | Admitting: Obstetrics and Gynecology

## 2012-06-04 DIAGNOSIS — R928 Other abnormal and inconclusive findings on diagnostic imaging of breast: Secondary | ICD-10-CM

## 2012-07-02 ENCOUNTER — Other Ambulatory Visit: Payer: Self-pay | Admitting: Internal Medicine

## 2012-07-02 NOTE — Telephone Encounter (Signed)
Refill x one year °

## 2012-08-18 ENCOUNTER — Other Ambulatory Visit: Payer: 59 | Admitting: Internal Medicine

## 2012-08-18 DIAGNOSIS — Z0000 Encounter for general adult medical examination without abnormal findings: Secondary | ICD-10-CM

## 2012-08-18 DIAGNOSIS — Z130 Encounter for screening for diseases of the blood and blood-forming organs and certain disorders involving the immune mechanism: Secondary | ICD-10-CM

## 2012-08-18 DIAGNOSIS — Z13220 Encounter for screening for lipoid disorders: Secondary | ICD-10-CM

## 2012-08-18 DIAGNOSIS — E039 Hypothyroidism, unspecified: Secondary | ICD-10-CM

## 2012-08-18 DIAGNOSIS — Z13228 Encounter for screening for other metabolic disorders: Secondary | ICD-10-CM

## 2012-08-18 LAB — CBC WITH DIFFERENTIAL/PLATELET
Basophils Relative: 0 % (ref 0–1)
Eosinophils Absolute: 0.2 10*3/uL (ref 0.0–0.7)
HCT: 39 % (ref 36.0–46.0)
Hemoglobin: 13.3 g/dL (ref 12.0–15.0)
MCH: 28 pg (ref 26.0–34.0)
MCHC: 34.1 g/dL (ref 30.0–36.0)
Monocytes Absolute: 0.8 10*3/uL (ref 0.1–1.0)
Monocytes Relative: 9 % (ref 3–12)
Neutrophils Relative %: 60 % (ref 43–77)
RDW: 13.1 % (ref 11.5–15.5)

## 2012-08-19 LAB — COMPREHENSIVE METABOLIC PANEL
Alkaline Phosphatase: 40 U/L (ref 39–117)
BUN: 12 mg/dL (ref 6–23)
Glucose, Bld: 85 mg/dL (ref 70–99)
Total Bilirubin: 0.4 mg/dL (ref 0.3–1.2)

## 2012-08-19 LAB — LIPID PANEL
HDL: 45 mg/dL (ref >39)
LDL Cholesterol: 137 mg/dL — ABNORMAL HIGH (ref 0–99)
Total CHOL/HDL Ratio: 4.6 Ratio
Triglycerides: 127 mg/dL (ref <150)
VLDL: 25 mg/dL (ref 0–40)

## 2012-08-19 LAB — TSH: TSH: 3.292 u[IU]/mL (ref 0.350–4.500)

## 2012-08-21 ENCOUNTER — Encounter: Payer: Self-pay | Admitting: Internal Medicine

## 2012-08-21 ENCOUNTER — Ambulatory Visit (INDEPENDENT_AMBULATORY_CARE_PROVIDER_SITE_OTHER): Payer: 59 | Admitting: Internal Medicine

## 2012-08-21 VITALS — BP 116/78 | HR 80 | Ht 64.0 in | Wt 158.0 lb

## 2012-08-21 DIAGNOSIS — Z0000 Encounter for general adult medical examination without abnormal findings: Secondary | ICD-10-CM

## 2012-08-21 DIAGNOSIS — E785 Hyperlipidemia, unspecified: Secondary | ICD-10-CM

## 2012-08-21 DIAGNOSIS — E039 Hypothyroidism, unspecified: Secondary | ICD-10-CM

## 2012-08-21 DIAGNOSIS — Z8669 Personal history of other diseases of the nervous system and sense organs: Secondary | ICD-10-CM

## 2012-08-21 LAB — POCT URINALYSIS DIPSTICK
Blood, UA: NEGATIVE
Ketones, UA: NEGATIVE
Protein, UA: NEGATIVE
Spec Grav, UA: >=1.03
Urobilinogen, UA: NEGATIVE
pH, UA: 5.5

## 2012-08-21 MED ORDER — SUMATRIPTAN 20 MG/ACT NA SOLN: 1.0000 | NASAL | Status: DC | PRN

## 2012-08-21 MED ORDER — ALPRAZOLAM 0.5 MG PO TABS: 0.5000 mg | ORAL_TABLET | Freq: Two times a day (BID) | ORAL | Status: DC | PRN

## 2012-08-21 MED ORDER — TOPIRAMATE 50 MG PO TABS: 50.0000 mg | ORAL_TABLET | Freq: Every day | ORAL | Status: DC

## 2012-08-21 MED ORDER — LEVOTHYROXINE SODIUM 100 MCG PO TABS: 100.0000 ug | ORAL_TABLET | Freq: Every day | ORAL | Status: DC

## 2012-08-21 MED ORDER — ALPRAZOLAM 0.5 MG PO TABS: 0.5000 mg | ORAL_TABLET | Freq: Every evening | ORAL | Status: DC | PRN

## 2012-08-21 MED ORDER — NEOMYCIN-POLYMYXIN-HC 3.5-10000-1 OT SUSP: 3.0000 [drp] | Freq: Four times a day (QID) | OTIC | Status: DC

## 2012-08-21 MED ORDER — BUTALBITAL-APAP-CAFFEINE 50-325-40 MG PO TABS: 1.0000 | ORAL_TABLET | Freq: Two times a day (BID) | ORAL | Status: DC | PRN

## 2012-08-22 ENCOUNTER — Encounter: Payer: Self-pay | Admitting: Internal Medicine

## 2012-08-22 NOTE — Progress Notes (Signed)
Subjective:    Patient ID: Natalie Kelly, female    DOB: Jun 20, 1970, 42 y.o.   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 History of hypothyroidism, migraine headache, hyperlipidemia. She is on thyroid replacement consisting of Synthroid 0.1 mg daily. TSH is a bit elevated this time. However tells me she's been taking thyroid replacement therapy with oral contraceptive. Advised just taking thyroid replacement alone without any other tablets were food omit the stomach. We will repeat TSH in a few weeks and see if she can remain on 0.1 mg daily. Ideally would like TSH to be around 1.00.   Confides today that husband has been drinking some recently and that has caused a problem for them. She is anxious sometimes. Asking for small quantity of Xanax to have on hand for anxiety for herself.  Migraines are controlled with Topamax prophylactically. Uses Imitrex nasal spray as needed for migraine and if rescue medication is needed she takes Fioricet.  With regard hypothyroidism, has seen Dr. Everardo All in the past and he concurred with treating her with thyroid replacement.  Past medical history: Hyperlipidemia was diagnosed in 2010. She tries to control it with diet. Dr. Arelia Sneddon is GYN physician. She is on oral contraceptives per GYN.  History of right patella fracture 2008.  She does not smoke or consume alcohol. She is married with children. Works as a case Aeronautical engineer at [employer]  Family history: Father with history of stroke, hypertension, headaches. Mother with history of headaches. One brother with history of headaches.    Review of Systems  Constitutional: Negative.   HENT: Negative.   Eyes: Negative.   Respiratory: Negative.   Cardiovascular: Negative.   Gastrointestinal: Negative.   Endocrine:       History of hyperlipidemia and hypothyroidism   Genitourinary: Negative.   Allergic/Immunologic: Negative.   Neurological:       History of migraine headaches controlled fairly well with Topamax  Hematological: Negative.   Psychiatric/Behavioral:       Recent anxiety with husband's drinking       Objective:   Physical Exam  Vitals reviewed. Constitutional: She is oriented to person, place, and time. She appears well-developed and well-nourished. No distress.  HENT:  Head: Normocephalic and atraumatic.  Left Ear: External ear normal.  Mouth/Throat: Oropharynx is clear and moist. No oropharyngeal exudate.  Eyes: Conjunctivae and EOM are normal. Pupils are equal, round, and reactive to light. Right eye exhibits no discharge. Left eye exhibits no discharge. No scleral icterus.  Neck: Neck supple. No JVD present. No thyromegaly present.  Cardiovascular: Normal rate, regular rhythm, normal heart sounds and intact distal pulses.   No murmur heard. Pulmonary/Chest: Effort normal and breath sounds normal. No respiratory distress. She has no wheezes. She has no rales. She exhibits no tenderness.  Breasts normal female  Abdominal: Soft. Bowel sounds are normal. She exhibits no distension and no mass. There is no tenderness. There is no rebound and no guarding.  Genitourinary:  Deferred to GYN  Musculoskeletal: Normal range of motion. She exhibits no edema.  Lymphadenopathy:    She has no cervical adenopathy.  Neurological: She is alert and oriented to person, place, and time. She has normal reflexes. No cranial nerve deficit. Coordination normal.  Skin: Skin is warm and dry. No rash noted. She is not diaphoretic.  Psychiatric: She has a normal mood and affect. Her behavior is normal.  Judgment and thought content normal.          Assessment & Plan:  Migraine headaches-controlled with Topamax prophylactically and when necessary Imitrex/Fioricet  Anxiety-related to husband's drinking. Prescribe Xanax 0.5 mg #60 one half to one by  mouth twice a day when necessary anxiety to take sparingly  History of hyperlipidemia-diet controlled total cholesterol 207 with an LDL cholesterol of 137. Patient does not want to be on statin medication. Continue diet and exercise.  Hypothyroidism-TSH is elevated a bit more than usual. She's been taking thyroid replacement with oral contraceptives. She is to take thyroid alone on it the stomach for the next several weeks and return for repeat TSH only. For now we'll not change dose of thyroid replacement.  Plan: Return for TSH in a few weeks on present dose of Synthroid without office visit. Otherwise return in 6 months. In 6 months will need TSH and office visit

## 2012-08-22 NOTE — Patient Instructions (Addendum)
Continue diet and exercise for hyperlipidemia. Continue same dose of thyroid replacement and return in a few weeks for TSH only. Otherwise return in 6 months. Takes Synthroid on it the stomach without any other medication.

## 2012-10-08 ENCOUNTER — Other Ambulatory Visit: Payer: 59 | Admitting: Internal Medicine

## 2012-10-08 DIAGNOSIS — E039 Hypothyroidism, unspecified: Secondary | ICD-10-CM

## 2012-10-09 ENCOUNTER — Other Ambulatory Visit: Payer: 59 | Admitting: Internal Medicine

## 2012-10-09 LAB — TSH: TSH: 1.976 u[IU]/mL (ref 0.350–4.500)

## 2012-12-17 ENCOUNTER — Other Ambulatory Visit: Payer: Self-pay

## 2013-03-10 ENCOUNTER — Other Ambulatory Visit: Payer: Self-pay | Admitting: Internal Medicine

## 2013-06-16 ENCOUNTER — Other Ambulatory Visit: Payer: Self-pay | Admitting: Internal Medicine

## 2013-06-16 NOTE — Telephone Encounter (Signed)
Refill through [facility]- due for recheck/PE August. Please call and book.

## 2013-09-30 ENCOUNTER — Other Ambulatory Visit: Payer: 59 | Admitting: Internal Medicine

## 2013-09-30 DIAGNOSIS — Z1329 Encounter for screening for other suspected endocrine disorder: Secondary | ICD-10-CM

## 2013-09-30 DIAGNOSIS — Z13228 Encounter for screening for other metabolic disorders: Secondary | ICD-10-CM

## 2013-09-30 DIAGNOSIS — E039 Hypothyroidism, unspecified: Secondary | ICD-10-CM

## 2013-09-30 DIAGNOSIS — Z13220 Encounter for screening for lipoid disorders: Secondary | ICD-10-CM

## 2013-09-30 DIAGNOSIS — Z130 Encounter for screening for diseases of the blood and blood-forming organs and certain disorders involving the immune mechanism: Secondary | ICD-10-CM

## 2013-09-30 LAB — CBC WITH DIFFERENTIAL/PLATELET
BASOS PCT: 1 % (ref 0–1)
Basophils Absolute: 0.1 10*3/uL (ref 0.0–0.1)
EOS ABS: 0.2 10*3/uL (ref 0.0–0.7)
EOS PCT: 2 % (ref 0–5)
HCT: 38.2 % (ref 36.0–46.0)
HEMOGLOBIN: 12.6 g/dL (ref 12.0–15.0)
LYMPHS ABS: 2.6 10*3/uL (ref 0.7–4.0)
Lymphocytes Relative: 27 % (ref 12–46)
MCH: 27.1 pg (ref 26.0–34.0)
MCHC: 33 g/dL (ref 30.0–36.0)
MCV: 82.2 fL (ref 78.0–100.0)
MONOS PCT: 8 % (ref 3–12)
Monocytes Absolute: 0.8 10*3/uL (ref 0.1–1.0)
Neutro Abs: 6 10*3/uL (ref 1.7–7.7)
Neutrophils Relative %: 62 % (ref 43–77)
PLATELETS: 385 10*3/uL (ref 150–400)
RBC: 4.65 MIL/uL (ref 3.87–5.11)
RDW: 14.3 % (ref 11.5–15.5)
WBC: 9.7 10*3/uL (ref 4.0–10.5)

## 2013-09-30 LAB — LIPID PANEL
CHOL/HDL RATIO: 3.6 ratio
CHOLESTEROL: 200 mg/dL (ref 0–200)
HDL: 55 mg/dL (ref >39)
LDL Cholesterol: 105 mg/dL — ABNORMAL HIGH (ref 0–99)
TRIGLYCERIDES: 202 mg/dL — AB (ref <150)
VLDL: 40 mg/dL (ref 0–40)

## 2013-09-30 LAB — COMPREHENSIVE METABOLIC PANEL
ALT: 10 U/L (ref 0–35)
AST: 13 U/L (ref 0–37)
Albumin: 4.3 g/dL (ref 3.5–5.2)
Alkaline Phosphatase: 59 U/L (ref 39–117)
BILIRUBIN TOTAL: 0.3 mg/dL (ref 0.2–1.2)
BUN: 10 mg/dL (ref 6–23)
CO2: 20 meq/L (ref 19–32)
CREATININE: 0.76 mg/dL (ref 0.50–1.10)
Calcium: 9.1 mg/dL (ref 8.4–10.5)
Chloride: 105 mEq/L (ref 96–112)
GLUCOSE: 93 mg/dL (ref 70–99)
Potassium: 5.1 mEq/L (ref 3.5–5.3)
Sodium: 137 mEq/L (ref 135–145)
Total Protein: 7.3 g/dL (ref 6.0–8.3)

## 2013-09-30 LAB — TSH: TSH: 3.343 u[IU]/mL (ref 0.350–4.500)

## 2013-10-01 ENCOUNTER — Ambulatory Visit (INDEPENDENT_AMBULATORY_CARE_PROVIDER_SITE_OTHER): Payer: 59 | Admitting: Internal Medicine

## 2013-10-01 ENCOUNTER — Encounter: Payer: Self-pay | Admitting: Internal Medicine

## 2013-10-01 VITALS — BP 124/84 | HR 68 | Temp 99.5°F | Ht 63.5 in | Wt 165.0 lb

## 2013-10-01 DIAGNOSIS — E785 Hyperlipidemia, unspecified: Secondary | ICD-10-CM

## 2013-10-01 DIAGNOSIS — Z0000 Encounter for general adult medical examination without abnormal findings: Secondary | ICD-10-CM

## 2013-10-01 DIAGNOSIS — E039 Hypothyroidism, unspecified: Secondary | ICD-10-CM

## 2013-10-01 DIAGNOSIS — Z8669 Personal history of other diseases of the nervous system and sense organs: Secondary | ICD-10-CM

## 2013-10-01 LAB — POCT URINALYSIS DIPSTICK
Bilirubin, UA: NEGATIVE
Glucose, UA: NEGATIVE
KETONES UA: NEGATIVE
Leukocytes, UA: NEGATIVE
Nitrite, UA: NEGATIVE
PH UA: 7.5
PROTEIN UA: NEGATIVE
RBC UA: NEGATIVE
SPEC GRAV UA: 1.01
Urobilinogen, UA: NEGATIVE

## 2013-10-01 MED ORDER — BUTALBITAL-APAP-CAFFEINE 50-325-40 MG PO TABS: 1.0000 | ORAL_TABLET | Freq: Two times a day (BID) | ORAL | Status: DC | PRN

## 2013-10-01 MED ORDER — TOPIRAMATE 50 MG PO TABS: 50.0000 mg | ORAL_TABLET | Freq: Every day | ORAL | Status: DC

## 2013-10-01 MED ORDER — SYNTHROID 100 MCG PO TABS: ORAL_TABLET | ORAL | Status: DC

## 2013-10-01 MED ORDER — SUMATRIPTAN 20 MG/ACT NA SOLN: NASAL | Status: DC

## 2013-10-01 NOTE — Patient Instructions (Addendum)
Watch diet. Continue same dose of Synthroid. Return in one year or as needed. Mammogram done through GYN.

## 2013-10-21 ENCOUNTER — Other Ambulatory Visit: Payer: Self-pay

## 2013-10-21 MED ORDER — NEOMYCIN-POLYMYXIN-HC 3.5-10000-1 OT SUSP
3.0000 [drp] | Freq: Four times a day (QID) | OTIC | Status: DC
Start: 2013-10-21 — End: 2014-12-05

## 2013-11-08 NOTE — Progress Notes (Signed)
   Subjective:    Patient ID: Natalie Kelly, female    DOB: 04-Apr-1970, 43 y.o.   MRN: [id number]  HPI 43 year old White Female in today for health maintenance exam and evaluation of medical issues. Not seen since last years physical exam. She has a history of hypothyroidism, migraine headaches, hyperlipidemia. She is on thyroid replacement therapy. Migraine headaches under fairly good control. Has seen Dr. Loanne Drilling in the past regarding hypothyroidism and he concurred treating her with thyroid replacement.  Past medical history: Hyperlipidemia was diagnosed in 2010. She tries to control it with diet. She is on oral contraceptives. Dr. Radene Knee is GYN physician. History of right patella fracture 2008.  Social history: She does not smoke or consume alcohol. She is married with children. She works as a Passenger transport manager at [employer].  Family history: Father with history of stroke hypertension and headaches. Mother with history of headaches. One brother with history of headaches.    Review of Systems  Constitutional: Negative.   HENT: Negative.   Respiratory: Negative.   Cardiovascular: Negative.   Gastrointestinal: Negative.   Genitourinary: Negative.   Neurological:       History of migraine headaches treated with Topamax       Objective:   Physical Exam  Vitals reviewed. Constitutional: She is oriented to person, place, and time. She appears well-nourished. No distress.  HENT:  Head: Normocephalic and atraumatic.  Right Ear: External ear normal.  Left Ear: External ear normal.  Mouth/Throat: Oropharynx is clear and moist. No oropharyngeal exudate.  Eyes: Pupils are equal, round, and reactive to light. Right eye exhibits no discharge. Left eye exhibits no discharge.  Neck: No JVD present. No thyromegaly present.  Cardiovascular: Normal rate and normal heart sounds.   No murmur heard. Pulmonary/Chest: Effort normal and breath sounds normal. She has no wheezes.  She has no rales.  Breasts normal female  Abdominal: Soft. Bowel sounds are normal. There is no tenderness. There is no rebound and no guarding.  Genitourinary:  Deferred to GYN  Musculoskeletal: She exhibits no edema.  Lymphadenopathy:    She has no cervical adenopathy.  Neurological: She is oriented to person, place, and time. She has normal reflexes. No cranial nerve deficit. Coordination normal.  Skin: Skin is warm. No rash noted. She is not diaphoretic.  Psychiatric: She has a normal mood and affect. Her behavior is normal. Judgment and thought content normal.          Assessment & Plan:  History of migraine headaches-stable and not too frequent  Hyperlipidemia-interestingly triglycerides have worsened from 127-202 in the past year. LDL cholesterol has improved from 137-105 in the past year. Total cholesterol has barely changed from 207-200. Continue diet and exercise.  Hypothyroidism-continue same dose of Synthroid  Plan: Continue same medications return in one year

## 2014-11-03 ENCOUNTER — Other Ambulatory Visit: Payer: Self-pay | Admitting: Internal Medicine

## 2014-11-04 ENCOUNTER — Other Ambulatory Visit: Payer: Self-pay | Admitting: Internal Medicine

## 2014-11-04 NOTE — Telephone Encounter (Signed)
Past due for annual CPE denied refill request 2 days ago. Please call pt.

## 2014-12-01 ENCOUNTER — Other Ambulatory Visit: Payer: 59 | Admitting: Internal Medicine

## 2014-12-01 DIAGNOSIS — Z130 Encounter for screening for diseases of the blood and blood-forming organs and certain disorders involving the immune mechanism: Secondary | ICD-10-CM

## 2014-12-01 DIAGNOSIS — Z0000 Encounter for general adult medical examination without abnormal findings: Secondary | ICD-10-CM

## 2014-12-01 DIAGNOSIS — Z1329 Encounter for screening for other suspected endocrine disorder: Secondary | ICD-10-CM

## 2014-12-01 DIAGNOSIS — Z13220 Encounter for screening for lipoid disorders: Secondary | ICD-10-CM

## 2014-12-01 LAB — CBC WITH DIFFERENTIAL/PLATELET
Basophils Absolute: 0 10*3/uL (ref 0.0–0.1)
Basophils Relative: 0 % (ref 0–1)
EOS ABS: 0.2 10*3/uL (ref 0.0–0.7)
EOS PCT: 2 % (ref 0–5)
HEMATOCRIT: 38.9 % (ref 36.0–46.0)
Hemoglobin: 12.8 g/dL (ref 12.0–15.0)
LYMPHS ABS: 2.2 10*3/uL (ref 0.7–4.0)
LYMPHS PCT: 22 % (ref 12–46)
MCH: 26.9 pg (ref 26.0–34.0)
MCHC: 32.9 g/dL (ref 30.0–36.0)
MCV: 81.9 fL (ref 78.0–100.0)
MPV: 9.6 fL (ref 8.6–12.4)
Monocytes Absolute: 0.8 10*3/uL (ref 0.1–1.0)
Monocytes Relative: 8 % (ref 3–12)
NEUTROS PCT: 68 % (ref 43–77)
Neutro Abs: 6.8 10*3/uL (ref 1.7–7.7)
PLATELETS: 389 10*3/uL (ref 150–400)
RBC: 4.75 MIL/uL (ref 3.87–5.11)
RDW: 14.5 % (ref 11.5–15.5)
WBC: 10 10*3/uL (ref 4.0–10.5)

## 2014-12-01 LAB — COMPREHENSIVE METABOLIC PANEL
ALK PHOS: 45 U/L (ref 33–115)
ALT: 10 U/L (ref 6–29)
AST: 13 U/L (ref 10–35)
Albumin: 4.2 g/dL (ref 3.6–5.1)
BILIRUBIN TOTAL: 0.3 mg/dL (ref 0.2–1.2)
BUN: 12 mg/dL (ref 7–25)
CALCIUM: 9.3 mg/dL (ref 8.6–10.2)
CO2: 20 mmol/L (ref 20–31)
Chloride: 110 mmol/L (ref 98–110)
Creat: 0.75 mg/dL (ref 0.50–1.10)
Glucose, Bld: 88 mg/dL (ref 65–99)
Potassium: 4.9 mmol/L (ref 3.5–5.3)
Sodium: 140 mmol/L (ref 135–146)
Total Protein: 7.1 g/dL (ref 6.1–8.1)

## 2014-12-01 LAB — LIPID PANEL
Cholesterol: 210 mg/dL — ABNORMAL HIGH (ref 125–200)
HDL: 42 mg/dL — AB (ref >=46)
LDL Cholesterol: 143 mg/dL — ABNORMAL HIGH (ref <130)
TRIGLYCERIDES: 125 mg/dL (ref <150)
Total CHOL/HDL Ratio: 5 Ratio (ref <=5.0)
VLDL: 25 mg/dL (ref <30)

## 2014-12-01 NOTE — Addendum Note (Signed)
Addended by: Beryle Quant on: 12/01/2014 10:38 AM   Modules accepted: Orders

## 2014-12-02 LAB — VITAMIN D 25 HYDROXY (VIT D DEFICIENCY, FRACTURES): Vit D, 25-Hydroxy: 41 ng/mL (ref 30–100)

## 2014-12-02 LAB — TSH: TSH: 3.305 u[IU]/mL (ref 0.350–4.500)

## 2014-12-05 ENCOUNTER — Telehealth: Payer: Self-pay

## 2014-12-05 ENCOUNTER — Encounter: Payer: Self-pay | Admitting: Internal Medicine

## 2014-12-05 ENCOUNTER — Ambulatory Visit (INDEPENDENT_AMBULATORY_CARE_PROVIDER_SITE_OTHER): Payer: 59 | Admitting: Internal Medicine

## 2014-12-05 VITALS — BP 112/84 | HR 79 | Temp 99.6°F | Resp 18 | Ht 63.0 in | Wt 166.0 lb

## 2014-12-05 DIAGNOSIS — T783XXA Angioneurotic edema, initial encounter: Secondary | ICD-10-CM | POA: Diagnosis not present

## 2014-12-05 DIAGNOSIS — E039 Hypothyroidism, unspecified: Secondary | ICD-10-CM

## 2014-12-05 DIAGNOSIS — Z0000 Encounter for general adult medical examination without abnormal findings: Secondary | ICD-10-CM

## 2014-12-05 DIAGNOSIS — Z8669 Personal history of other diseases of the nervous system and sense organs: Secondary | ICD-10-CM | POA: Diagnosis not present

## 2014-12-05 DIAGNOSIS — E785 Hyperlipidemia, unspecified: Secondary | ICD-10-CM

## 2014-12-05 DIAGNOSIS — E669 Obesity, unspecified: Secondary | ICD-10-CM | POA: Diagnosis not present

## 2014-12-05 LAB — POCT URINALYSIS DIPSTICK
BILIRUBIN UA: NEGATIVE
Glucose, UA: NEGATIVE
KETONES UA: NEGATIVE
Leukocytes, UA: NEGATIVE
Nitrite, UA: NEGATIVE
PROTEIN UA: NEGATIVE
SPEC GRAV UA: 1.025
Urobilinogen, UA: 0.2
pH, UA: 6.5

## 2014-12-05 MED ORDER — LEVOTHYROXINE SODIUM 112 MCG PO TABS: 112.0000 ug | ORAL_TABLET | Freq: Every day | ORAL | Status: DC

## 2014-12-05 MED ORDER — BUTALBITAL-APAP-CAFFEINE 50-325-40 MG PO TABS: 1.0000 | ORAL_TABLET | Freq: Two times a day (BID) | ORAL | Status: DC | PRN

## 2014-12-05 MED ORDER — TOPIRAMATE 50 MG PO TABS: 50.0000 mg | ORAL_TABLET | Freq: Every day | ORAL | Status: DC

## 2014-12-05 MED ORDER — SUMATRIPTAN 20 MG/ACT NA SOLN: NASAL | Status: DC

## 2014-12-05 MED ORDER — EPINEPHRINE 0.3 MG/0.3ML IJ SOAJ
0.3000 mg | Freq: Once | INTRAMUSCULAR | Status: DC
Start: 2014-12-05 — End: 2019-05-20

## 2014-12-05 MED ORDER — NEOMYCIN-POLYMYXIN-HC 3.5-10000-1 OT SUSP: 3.0000 [drp] | Freq: Four times a day (QID) | OTIC | Status: DC

## 2014-12-05 NOTE — Telephone Encounter (Signed)
Referral to Dr Neldon Mc.  Appointment 12/27/2014 at [place], at the [facility] location.  Patient aware.

## 2014-12-05 NOTE — Progress Notes (Signed)
Subjective:    Patient ID: Natalie Kelly, female    DOB: 05-26-1970, 44 y.o.   MRN: [id number]  HPI 44 year old White Female in today for health maintenance exam and evaluation of medical issues. History of hyperlipidemia and migraine headaches. Recently over the past month or so she's had several episodes of lip swelling. Not sure what is triggering this. Doesn't think it could be any foods except on one occasion it could've been some [place] food that she ate. She wound up taking up to 150 mg of Benadryl to get relief. One episode awakened her from sleep. 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 Doesn't eat shellfish. Eats a fair amount of chocolate. Never had this before.  Migraine headaches are under good control. Takes Topamax daily.  She has a history of hypothyroidism and is on thyroid replacement therapy. She has seen Dr. Loanne Drilling in the past regarding hypothyroidism and he concurred treating her with thyroid replacement. TSH is slightly over 3 on Synthroid 0.1 mg daily. She feels well on this dose.  Past medical history: Hyperlipidemia diagnosed in 2010. She tries to control it with diet. Is not pleased with her weight. Weight in August 2015 was 165 pounds and is now 166 pounds. Doesn't get exercise. She works full-time as a case Therapist, art at [employer]. Even works from home on Sundays to get all the work done. It's very stressful.  Dr. Radene Knee is GYN physician.  History of right patella fracture 2008.  Social history: Does not smoke or consume alcohol. She is married with children.  Family history: Father with history of stroke, hypertension, headaches. Mother with history of headaches. One brother with history of headaches.    Review of Systems  Constitutional: Positive for fatigue.  HENT:        episodes of lip swelling in the past 4 weeks  Eyes: Negative.   Respiratory:  Negative.   Endocrine:       Hypothyroidism but feels well on current dose of thyroid replacement  Genitourinary: Negative.   Neurological:       History of migraine headaches       Objective:   Physical Exam  Constitutional: She is oriented to person, place, and time. She appears well-developed and well-nourished. No distress.  HENT:  Head: Normocephalic and atraumatic.  Right Ear: External ear normal.  Left Ear: External ear normal.  Mouth/Throat: Oropharynx is clear and moist.  Eyes: Conjunctivae and EOM are normal. Pupils are equal, round, and reactive to light. Right eye exhibits no discharge. Left eye exhibits no discharge. No scleral icterus.  Neck: Neck supple. No JVD present. No thyromegaly present.  Cardiovascular: Normal rate and regular rhythm.   1/6 systolic ejection murmur upper right sternal border in supine position  Pulmonary/Chest: Effort normal and breath sounds normal. No respiratory distress. She has no wheezes. She has no rales.  Breasts normal female  Abdominal: Soft. Bowel sounds are normal. She exhibits no mass. There is no tenderness. There is no rebound and no guarding.  Genitourinary:  Deferred to GYN physician  Lymphadenopathy:    She has no cervical adenopathy.  Neurological: She is alert and oriented to person, place, and time. She has normal reflexes. No cranial nerve deficit. Coordination normal.  Skin: Skin is warm and dry. No rash noted. She is not diaphoretic.  Psychiatric: She has a normal mood and affect. Her behavior is  normal. Judgment and thought content normal.          Assessment & Plan:  Several episodes of angioedema in the past 4 weeks. Etiology unclear. Not sure that it's related to food intake. Never had allergy testing. Refer to allergist for further evaluation. EpiPen prescribed.  Migraine headaches-stable and under good control with chronic Topamax therapy. Refilled Imitrex and Fioricet to take if  needed  Hypothyroidism-increase thyroid replacement 0.112 mg daily and have her return in 6 weeks for TSH without office visit.  Hyperlipidemia-to have repeat fasting lipid panel and TSH in 6 months. Encouraged diet and exercise.  Plan: See above regarding allergy appointment for angioedema, treatment of migraine headaches, thyroid replacement therapy, encouraged diet and exercise for hyperlipidemia.

## 2014-12-05 NOTE — Patient Instructions (Addendum)
RTC 6 weeks for TSH. Watch diet and exercise. It was a pleasure to see you today. Appt with allergist. Epipen for angioedema. Return in 6 months for office visit, TSH, fasting lipid panel

## 2014-12-27 ENCOUNTER — Encounter: Payer: Self-pay | Admitting: Allergy and Immunology

## 2014-12-27 ENCOUNTER — Ambulatory Visit (INDEPENDENT_AMBULATORY_CARE_PROVIDER_SITE_OTHER): Payer: 59 | Admitting: Allergy and Immunology

## 2014-12-27 VITALS — BP 148/82 | HR 72 | Temp 98.3°F | Resp 16 | Ht 63.78 in | Wt 163.8 lb

## 2014-12-27 DIAGNOSIS — T783XXA Angioneurotic edema, initial encounter: Secondary | ICD-10-CM | POA: Diagnosis not present

## 2014-12-27 MED ORDER — MONTELUKAST SODIUM 10 MG PO TABS: ORAL_TABLET | ORAL | Status: DC

## 2014-12-27 NOTE — Patient Instructions (Signed)
  1. Avoidance measures  2. Daily preventative medications:   A. Loratadine 10mg  - two tablets one time per day  B. Montelukast 10mg  - one tablet one time per day  3. Continue Epi-Pen, benadryl, MD / ER for allergic reaction  4. Call clinic if still with angioedema; use medications for 8 week blocks.  5. Further evaluation?

## 2014-12-27 NOTE — Progress Notes (Signed)
[place]    NEW PATIENT NOTE  Referring Provider: Elby Showers, MD Primary Provider: Elby Showers, MD    Subjective:   Patient ID: Natalie Kelly is a 44 y.o. female with a chief complaint of Angioedema  and the following problems:  HPI Comments:  Desiray affecting predominantly her lips with 8 episodes during this timeframe without any associated systemic or constitutional symptoms or obvious trigger. She will take Benadryl when she develops angioedema and her episodes will last somewhere between 2-18 hours. She's had blood tests performed in September which apparently have been normal. She has not started any new medications in 2016. She does not use any new supplements or vitamins in 2016. She does not have any associated urticaria or other atopic symptoms.    Past Medical History  Diagnosis Date  . Thyroid disease     hypothyroidism  . Migraine headache   . Hyperlipidemia   . Seborrhea     ear  . Anxiety   . Depression     Past Surgical History  Procedure Laterality Date  . Orif patella fracture Left 2007  . Cesarean section  2001 AND 2004    Outpatient Encounter Prescriptions as of 12/27/2014  Medication Sig  . ALPRAZolam (XANAX) 0.5 MG tablet Take 1 tablet (0.5 mg total) by mouth 2 (two) times daily as needed for sleep.  Marland Kitchen aspirin 81 MG tablet Take 81 mg by mouth daily.  . Biotin 10 MG TABS Take by mouth.  . butalbital-acetaminophen-caffeine (FIORICET, ESGIC) 50-325-40 MG tablet Take 1 tablet by mouth 2 (two) times daily as needed.  Marland Kitchen CALCIUM PO Take 1,200 mg by mouth daily.  . Cholecalciferol (VITAMIN D) 2000 UNITS CAPS Take by mouth.  . co-enzyme Q-10 30 MG capsule Take 30 mg by mouth 3 (three) times daily.  Marland Kitchen EPINEPHrine (EPIPEN 2-PAK) 0.3 mg/0.3 mL IJ SOAJ injection Inject 0.3 mLs (0.3 mg total) into the muscle once.  Marland Kitchen levothyroxine (SYNTHROID, LEVOTHROID) 112 MCG tablet Take 1 tablet (112 mcg  total) by mouth daily.  Marland Kitchen neomycin-polymyxin-hydrocortisone (CORTISPORIN) 3.5-10000-1 otic suspension Place 3 drops into both ears 4 (four) times daily.  . norgestrel-ethinyl estradiol (OGESTROL) 0.5-50 MG-MCG tablet Take 1 tablet by mouth daily.  . Omega-3 Fatty Acids (FISH OIL PO) Take 1 capsule by mouth daily.  . SUMAtriptan (IMITREX) 20 MG/ACT nasal spray Use as directed for migraine headache  . topiramate (TOPAMAX) 50 MG tablet Take 1 tablet (50 mg total) by mouth daily.  . TURMERIC PO Take 800 mg by mouth daily.  . Ascorbic Acid (VITAMIN C WITH ROSE HIPS) 1000 MG tablet Take 1,000 mg by mouth daily.  . calcium carbonate 200 MG capsule Take 250 mg by mouth 2 (two) times daily with a meal.  . fish oil-omega-3 fatty acids 1000 MG capsule Take 2 g by mouth daily.  . Turmeric 500 MG CAPS Take by mouth.  . vitamin E (VITAMIN E) 400 UNIT capsule Take 400 Units by mouth daily.   No facility-administered encounter medications on file as of 12/27/2014.    No orders of the defined types were placed in this encounter.    No Known Allergies  Review of Systems  Constitutional: Negative for fever, chills and fatigue.  HENT: Negative for congestion, ear pain, facial swelling, nosebleeds, postnasal drip, rhinorrhea, sinus pressure, sneezing, sore throat, tinnitus, trouble swallowing and voice change.   Eyes: Negative for pain, discharge, redness and itching.  Respiratory:  Negative for cough, choking, chest tightness, shortness of breath, wheezing and stridor.   Cardiovascular: Negative for chest pain and leg swelling.  Gastrointestinal: Negative for nausea, vomiting and abdominal pain.  Endocrine: Negative for cold intolerance and heat intolerance.  Musculoskeletal: Negative for myalgias and arthralgias.  Allergic/Immunologic: Negative.   Neurological: Negative for dizziness.  Hematological: Negative for adenopathy.    Family History  Problem Relation Age of Onset  . Stroke Father   .  Hypertension Father   . Hyperlipidemia Father   . Diabetes Father   . Hyperlipidemia Mother   . Asthma Daughter   . Congestive Heart Failure Maternal Grandmother   . Parkinson's disease Paternal Grandmother     Social History   Social History  . Marital Status: Married    Spouse Name: N/A  . Number of Children: N/A  . Years of Education: N/A   Occupational History  . Not on file.   Social History Main Topics  . Smoking status: Never Smoker   . Smokeless tobacco: Never Used  . Alcohol Use: No  . Drug Use: No  . Sexual Activity: Not on file   Other Topics Concern  . Not on file   Social History Narrative    Environmental and Social history  Lives in a house with a dry environment, a cat and dog located inside the household, carpeting in the bedroom, no plastic on the bed or pillows, and no smokers located inside the household. 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   Objective:   Filed Vitals:   12/27/14 0821  BP: 148/82  Pulse: 72  Temp: 98.3 F (36.8 C)  Resp: 16   Height: 5' 3.78" (162 cm) Weight: 163 lb 12.8 oz (74.3 kg)  Physical Exam  Constitutional: She appears well-developed and well-nourished. No distress.  HENT:  Head: Normocephalic and atraumatic. Head is without right periorbital erythema and without left periorbital erythema.  Right Ear: Tympanic membrane, external ear and ear canal normal. No drainage or tenderness. No foreign bodies. Tympanic membrane is not injected, not scarred, not perforated, not erythematous, not retracted and not bulging. No middle ear effusion.  Left Ear: External ear and ear canal normal. No drainage or tenderness. No foreign bodies. Tympanic membrane is scarred. Tympanic membrane is not injected, not perforated, not erythematous, not retracted and not bulging.  No middle ear effusion.  Nose: Nose normal. No mucosal edema, rhinorrhea, nose lacerations or sinus tenderness.  No foreign bodies.  Mouth/Throat:  Oropharynx is clear and moist. No oropharyngeal exudate, posterior oropharyngeal edema, posterior oropharyngeal erythema or tonsillar abscesses.  Eyes: Lids are normal. Right eye exhibits no chemosis, no discharge and no exudate. No foreign body present in the right eye. Left eye exhibits no chemosis, no discharge and no exudate. No foreign body present in the left eye. Right conjunctiva is not injected. Left conjunctiva is not injected.  Neck: Neck supple. No tracheal tenderness present. No tracheal deviation and no edema present. No thyroid mass and no thyromegaly present.  Cardiovascular: Normal rate, regular rhythm, S1 normal and S2 normal.  Exam reveals no gallop.   No murmur heard. Pulmonary/Chest: No accessory muscle usage or stridor. No respiratory distress. She has no wheezes. She has no rhonchi. She has no rales.  Abdominal: Soft.  Lymphadenopathy:       Head (right side): No tonsillar adenopathy present.       Head (left side): No tonsillar adenopathy present.    She has no cervical  adenopathy.  Neurological: She is alert.  Skin: No rash noted. She is not diaphoretic.  Psychiatric: She has a normal mood and affect. Her behavior is normal.    Diagnostics:  Allergy skin tests were performed. She did not demonstrate any hypersensitivity to a screening panel of foods.  Review of blood tests performed by Dr. Renold Genta on 20 11/2014 identifies a normal CBC with differential, normal metabolic panel, normal TSH, and normal urinalysis.  Assessment and Plan:    1. Angioedema, initial encounter     1. Avoidance measures  2. Daily preventative medications:   A. Loratadine 10mg  - two tablets one time per day  B. Montelukast 10mg  - one tablet one time per day  3. Continue Epi-Pen, benadryl, MD / ER for allergic reaction  4. Call clinic if still with angioedema; use medications for 8 week blocks.  5. Further evaluation?  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 Before we launch off on any further evaluation at this point in time we are just going to treat her empirically with an H2 receptor blocker and a leukotriene receptor antagonist in blocks of 8 weeks hoping that her immunological hyperreactivity will burn out and not require any further evaluation or treatment. Certainly if she develops increasing frequency or intensity or lack of response to medical therapy or associated systemic or constitutional symptoms she will require further evaluation and treatment.    Allena Katz, MD [place]

## 2015-01-19 ENCOUNTER — Other Ambulatory Visit: Payer: 59 | Admitting: Internal Medicine

## 2015-01-19 DIAGNOSIS — E039 Hypothyroidism, unspecified: Secondary | ICD-10-CM

## 2015-01-19 LAB — TSH: TSH: 1.116 u[IU]/mL (ref 0.350–4.500)

## 2015-03-09 ENCOUNTER — Other Ambulatory Visit: Payer: Self-pay

## 2015-03-09 MED ORDER — LEVOTHYROXINE SODIUM 112 MCG PO TABS: 112.0000 ug | ORAL_TABLET | Freq: Every day | ORAL | Status: DC

## 2015-03-09 MED FILL — TOPIRAMATE 50 MG TABLET: 50 | 90 days supply | Qty: 90 | Fill #1

## 2015-03-09 MED FILL — SYNTHROID 112 MCG TABLET: 112 | 90 days supply | Qty: 90 | Fill #0

## 2015-03-09 NOTE — Telephone Encounter (Signed)
Patient seen for CPE in 11/2014. Return 6 mo. APPT scheduled for end of April

## 2015-04-05 MED FILL — OGESTREL TABLET: 0.5-50 | 84 days supply | Qty: 84 | Fill #2

## 2015-06-05 ENCOUNTER — Other Ambulatory Visit: Payer: 59 | Admitting: Internal Medicine

## 2015-06-05 ENCOUNTER — Other Ambulatory Visit: Payer: Self-pay | Admitting: Internal Medicine

## 2015-06-05 DIAGNOSIS — Z131 Encounter for screening for diabetes mellitus: Secondary | ICD-10-CM | POA: Diagnosis not present

## 2015-06-05 DIAGNOSIS — E785 Hyperlipidemia, unspecified: Secondary | ICD-10-CM | POA: Diagnosis not present

## 2015-06-05 DIAGNOSIS — E039 Hypothyroidism, unspecified: Secondary | ICD-10-CM

## 2015-06-06 LAB — LIPID PANEL
Cholesterol: 208 mg/dL — ABNORMAL HIGH (ref 125–200)
HDL: 38 mg/dL — ABNORMAL LOW (ref >=46)
LDL CALC: 129 mg/dL (ref <130)
Total CHOL/HDL Ratio: 5.5 Ratio — ABNORMAL HIGH (ref <=5.0)
Triglycerides: 205 mg/dL — ABNORMAL HIGH (ref <150)
VLDL: 41 mg/dL — AB (ref <30)

## 2015-06-06 LAB — TSH: TSH: 1.04 mIU/L

## 2015-06-08 ENCOUNTER — Ambulatory Visit (INDEPENDENT_AMBULATORY_CARE_PROVIDER_SITE_OTHER): Payer: 59 | Admitting: Internal Medicine

## 2015-06-08 ENCOUNTER — Encounter: Payer: Self-pay | Admitting: Internal Medicine

## 2015-06-08 VITALS — BP 132/84 | HR 80 | Temp 99.0°F | Resp 18 | Ht 64.0 in | Wt 168.0 lb

## 2015-06-08 DIAGNOSIS — Z8669 Personal history of other diseases of the nervous system and sense organs: Secondary | ICD-10-CM

## 2015-06-08 DIAGNOSIS — E781 Pure hyperglyceridemia: Secondary | ICD-10-CM

## 2015-06-08 DIAGNOSIS — E669 Obesity, unspecified: Secondary | ICD-10-CM | POA: Diagnosis not present

## 2015-06-08 DIAGNOSIS — E039 Hypothyroidism, unspecified: Secondary | ICD-10-CM

## 2015-06-08 LAB — HEMOGLOBIN A1C
HEMOGLOBIN A1C: 5.9 % — AB (ref <5.7)
MEAN PLASMA GLUCOSE: 123 mg/dL

## 2015-06-08 MED FILL — TOPIRAMATE 50 MG TABLET: 50 | 90 days supply | Qty: 90 | Fill #2

## 2015-06-08 NOTE — Patient Instructions (Addendum)
We will arrange dietary consultation with Dr. Jenne Campus. Need diet and exercise regimen that you can enjoy in stick 2. Screen for diabetes with hemoglobin A1c. Schedule physical exam for Fall 2017. Continue same dose of thyroid replacement.

## 2015-06-08 NOTE — Progress Notes (Signed)
   Subjective:    Patient ID: Natalie Kelly, female    DOB: 02-09-1971, 45 y.o.   MRN: [id number]  HPI Here today to follow-up on hyperlipidemia and hypothyroidism. TSH is stable. However her trunk glycerides have gone up significantly. Says she's probably not following a strict diet. Not exercising much. I believe could benefit from dietary consultation. Her total cholesterol 6 months ago was 210 and is now 208. Triglycerides have increased from 125-205. HDL cholesterol has slightly decreased from 42-38. LDL cholesterol has decreased from 143-129. I'm concerned about the jump in her triglycerides. We are going to screen for diabetes. Hemoglobin A1c added to lab work today.  Has a history of migraine headaches which are stable on current treatment    Review of Systems     Objective:   Physical Exam  Not examined. Spent 25 minutes speaking with her about these issues.      Assessment & Plan:  Hyperlipidemia-significant increase in triglycerides since October 2016. Screen for diabetes with elevated triglycerides  Hypothyroidism-TSH is stable  Obesity-needs diet and exercise regimen. Could benefit from dietary counseling. We will arrange dietary consultation.  History of migraine headaches-stable with current treatment

## 2015-06-09 ENCOUNTER — Ambulatory Visit: Payer: 59 | Admitting: Internal Medicine

## 2015-06-13 MED FILL — SYNTHROID 112 MCG TABLET: 112 | 90 days supply | Qty: 90 | Fill #1

## 2015-06-14 ENCOUNTER — Telehealth: Payer: Self-pay

## 2015-06-14 NOTE — Telephone Encounter (Signed)
I have left a voicemail for Dr. Jenne Campus advising her of the referral requested by Dr. Renold Genta. She is currently out of the office and will return on Monday May 8th.

## 2015-06-29 MED FILL — OGESTREL TABLET: 0.5-50 | 84 days supply | Qty: 84 | Fill #3

## 2015-07-18 ENCOUNTER — Encounter: Payer: Self-pay | Admitting: Podiatry

## 2015-07-18 ENCOUNTER — Ambulatory Visit (INDEPENDENT_AMBULATORY_CARE_PROVIDER_SITE_OTHER): Payer: 59

## 2015-07-18 ENCOUNTER — Ambulatory Visit (INDEPENDENT_AMBULATORY_CARE_PROVIDER_SITE_OTHER): Payer: 59 | Admitting: Podiatry

## 2015-07-18 VITALS — BP 128/90 | HR 84 | Resp 12

## 2015-07-18 DIAGNOSIS — M722 Plantar fascial fibromatosis: Secondary | ICD-10-CM

## 2015-07-18 DIAGNOSIS — M779 Enthesopathy, unspecified: Secondary | ICD-10-CM | POA: Diagnosis not present

## 2015-07-18 DIAGNOSIS — M205X1 Other deformities of toe(s) (acquired), right foot: Secondary | ICD-10-CM | POA: Diagnosis not present

## 2015-07-18 MED ORDER — MELOXICAM 15 MG PO TABS: 15.0000 mg | ORAL_TABLET | Freq: Every day | ORAL | Status: DC

## 2015-07-18 MED ORDER — METHYLPREDNISOLONE 4 MG PO TBPK: ORAL_TABLET | ORAL | Status: DC

## 2015-07-18 MED FILL — MELOXICAM 15 MG TABLET: 15 | 30 days supply | Qty: 30 | Fill #0

## 2015-07-18 MED FILL — METHYLPREDNISOLONE 4 MG TAB: 4 | 6 days supply | Qty: 21 | Fill #0

## 2015-07-18 NOTE — Progress Notes (Signed)
   Subjective:    Patient ID: Natalie Kelly, female    DOB: [birth year], 45 y.o.   MRN: [id number]  HPI: She presents today with a chief complaint of pain to the ball of the foot right greater than left. She has a history of plantar fasciitis. She states that she has tried padding to note well. Denies any trauma states that it began just a few months ago around March.    Review of Systems  Musculoskeletal: Positive for gait problem.       Objective:   Physical Exam: Vital signs are stable alert and oriented 3 pulses are palpable. I have reviewed her past medical history medications allergies surgeries and social history. Neurologic sensorium is intact. Deep tendon reflexes are intact muscle strength is normal. Orthopedic evaluation shows A assist with ankle full range of motion without crepitation. She has pain on end range of motion of the second metatarsophalangeal joint of the right foot as opposed to the left foot where there is no pain. She does have thickening and swelling around the second metatarsophalangeal joint with tenderness on palpation and range of motion. Radiographs do demonstrate soft tissue increase in density surrounding second metatarsophalangeal joint which is slightly elongated in comparison to the surrounding metatarsals. Cutaneous evaluation demonstrates no open lesions or wounds.        Assessment & Plan:  Assessment: Forefoot capsulitis second metatarsophalangeal joint right foot over left.  Plan: Periarticular injection with Kenalog and local anesthetic. Start her on a Medrol Dosepak to be followed by meloxicam. Discussed appropriate shoe gear stretching exercises ice therapy and shoe gear modification.

## 2015-07-20 ENCOUNTER — Ambulatory Visit (INDEPENDENT_AMBULATORY_CARE_PROVIDER_SITE_OTHER): Payer: 59 | Admitting: Family Medicine

## 2015-07-20 ENCOUNTER — Encounter: Payer: Self-pay | Admitting: Family Medicine

## 2015-07-20 VITALS — Ht 64.0 in | Wt 169.4 lb

## 2015-07-20 DIAGNOSIS — E663 Overweight: Secondary | ICD-10-CM

## 2015-07-20 DIAGNOSIS — E781 Pure hyperglyceridemia: Secondary | ICD-10-CM | POA: Diagnosis not present

## 2015-07-20 NOTE — Patient Instructions (Addendum)
  Diet Recommendations for Diabetes   Starchy (carb) foods: Bread, rice, pasta, potatoes, corn, cereal, grits, crackers, bagels, muffins, all baked goods.  (Fruits, milk, and yogurt also have carbohydrate, but most of these foods will not spike your blood sugar as the starchy foods will.)  A few fruits do cause high blood sugars; use small portions of bananas (limit to 1/2 at a time), grapes, watermelon, oranges, and most tropical fruits.    Protein foods: Meat, fish, poultry, eggs, dairy foods, soy foods, and beans such as pinto and kidney beans (beans also provide carbohydrate).   1. Eat at least 3 REAL meals and 1-2 snacks per day. Never go more than 4-5 hours while awake without eating. Eat breakfast within the first hour of getting up.    - A real meal should look like, taste like, and feel like a meal, not a snack.    - Would you serve this to a guest in your home, and call it a meal?  - A real meal includes protein, starch, and veg's and/or fruit.    - Breakfast ideas: Yogurt, fruit, veg smoothie; sandwich & fruit; "muffin tin omelettes"; Eng muffin egg sandwich; yogurt with nuts and fruit.    - Dinner:  Advance planning will be critical, i.e., some pre-preparation.   2. Limit starchy foods to TWO per meal and ONE per snack. ONE portion of a starchy  food is equal to the following:   - ONE slice of bread (or its equivalent, such as half of a hamburger bun).   - 1/2 cup of a "scoopable" starchy food such as potatoes or rice.   - 15 grams of carbohydrate as shown on food label.  3. Include at every meal: a protein food, a carb food, and vegetables and/or fruit.   - Obtain twice the volume of veg's as protein or carbohydrate foods for both lunch and dinner.   - Fresh or frozen veg's are best.   - Keep frozen veg's on hand for a quick vegetable serving.    4. Physical activity: At least 30 minutes 3 days a week.    - Record your minutes of exercise.    - Microbreaks:  ~2 minutes activity for  every 20 you sit or 5 min for every 60 min you sit.    - [employer]:  Lots of good info on how to feed families.  One book I especially recommend: How to get your kid to eat, but not too much.

## 2015-07-20 NOTE — Progress Notes (Signed)
Medical Nutrition Therapy:  Appt start time: 1600 end time:  1700.  Assessment:  Primary concerns today: Weight management, Blood sugar control and hypertriglyceridemia.   Natalie Kelly would like to weigh in the 140s, and Natalie Kelly is concerned about Natalie Kelly cholesterol and Natalie Kelly A1C.  Natalie Kelly has had success with [employer] after Natalie Kelly children were born.  Natalie Kelly lives with Natalie Kelly husband, 53-YO daughter, and 13-YO son.  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    Lab Results  Component Value Date   HGBA1C 5.9* 06/05/2015   Learning Readiness: In progress; has started brining salad for lunch 3 X wk; buys in cafeteria other days.  As a [employer], Natalie Kelly does not usually consume meat, caffeine, or alcohol.    Usual eating pattern includes 2-3 meals and 1-2 snacks per day.  Usual "breakfast" is a breakfast bar after Natalie Kelly gets to work.   Frequent foods and beverages include water, herb tea, pasta, salads, chx, fish, granola/protein bar, soyfoods.  Avoided foods include pork products, shellfish, caffeine (usually), alcohol, condiments.   Usual physical activity includes none currently.  24-hr recall suggest an intake of ~1350 kcal: (Up at 6:45 AM; synthroid & water) B (9:30 AM)-   4 Belvita bl'berry bkfst biscuits, water, herb tea     230 Snk (11 AM)-   1/2 Honeycrisp apple, water          60 L (1 PM)-  Salad, 2 T drsng, 2 T sunflr sds, 8 croutons, 1 T cranberries, 2 T chs, 2 oz tuna, 2 Club crkrs, 1 c V8 Splash Snk (4 PM)-  1 choc Kiss, water         580+ D (6:15 PM)-  1 [employer] snack potato soft taco w/ chs, water     250 Snk ( PM)-  1 slc chs cake pie         200+ Typical day? Yes.  for a Wed (lots of driving).    Progress Towards Goal(s):  In progress.   Nutritional Diagnosis:  NI-5.8.3 Inappropriate intake of types of carbohydrates (specify):   As related to processed foods.  As evidenced by yesterday's intake of highly processed foods for both breakfast and dinner, as well as evening snack.     Intervention:  Nutrition Education.  Handouts given during visit include:  AVS  Goals sheet  Demonstrated degree of understanding via:  Teach Back  Barriers to learning/adherence to lifestyle change: Busy schedule makes planning and time for exercise difficult.   Monitoring/Evaluation:  Dietary intake, exercise, and body weight in 6 week(s).  No appts available sooner.

## 2015-08-29 ENCOUNTER — Ambulatory Visit (INDEPENDENT_AMBULATORY_CARE_PROVIDER_SITE_OTHER): Payer: 59 | Admitting: Family Medicine

## 2015-08-29 ENCOUNTER — Ambulatory Visit (INDEPENDENT_AMBULATORY_CARE_PROVIDER_SITE_OTHER): Payer: 59 | Admitting: Podiatry

## 2015-08-29 ENCOUNTER — Encounter: Payer: Self-pay | Admitting: Podiatry

## 2015-08-29 ENCOUNTER — Encounter: Payer: Self-pay | Admitting: Family Medicine

## 2015-08-29 VITALS — Ht 64.0 in | Wt 165.6 lb

## 2015-08-29 DIAGNOSIS — E663 Overweight: Secondary | ICD-10-CM

## 2015-08-29 DIAGNOSIS — M779 Enthesopathy, unspecified: Secondary | ICD-10-CM | POA: Diagnosis not present

## 2015-08-29 DIAGNOSIS — E781 Pure hyperglyceridemia: Secondary | ICD-10-CM | POA: Diagnosis not present

## 2015-08-29 NOTE — Progress Notes (Signed)
She presents today for follow-up of her capsulitis second metatarsophalangeal joints bilaterally. She states that she is approximately 70-80% improved however she has discontinued the use of the meloxicam since she ran out of it.  Objective: Vital signs are stable she is alert and oriented 3. Pulses are palpable. She has no pain on palpation or end range of motion of the second metatarsophalangeal joint of the left foot and it actually feels very normal on palpation. However the second metatarsophalangeal joint of the right foot still appears to be thickened plantarly with edema and is tender on palpation with fluctuance within the joint. It is not hot to the touch.  Assessment: Resolving capsulitis second metatarsophalangeal joints bilateral with residual pain to the second metatarsophalangeal joint right foot.  Plan: Discussed etiology pathology conservative or surgical therapies. She will refill her meloxicam. I offered her another injection which she declined and I discussed appropriate shoe gear. Follow-up with her in 1 month or sooner if needed.

## 2015-08-29 NOTE — Patient Instructions (Addendum)
-   TASTE PREFERENCES ARE LEARNED.  This means that it will get easier to choose foods you know are good for you if you are exposed to them enough.   - Pretty much any sweet beverages from restaurants will have a LOT of sugar in them, i.e., small [employer] lemonade = 45 g sugar; medium = 61 grams.    Goals remain the same: 1. Eat at least 3 REAL meals and 1-2 snacks per day. Never go more than 4-5 hours while awake without eating. Eat breakfast within the first hour of getting up.  - A real meal should look like, taste like, and feel like a meal, not a snack.  - Would you serve this to a guest in your home, and call it a meal? - A real meal includes protein, starch, and veg's and/or fruit.  - Breakfast ideas: Yogurt, fruit, veg smoothie; sandwich & fruit; "muffin tin omelettes"; Eng muffin egg sandwich; yogurt with nuts and fruit.  - Dinner: Advance planning will be critical, i.e., some pre-preparation.  2. Limit starchy foods to TWO per meal and ONE per snack. ONE portion of a starchy food is equal to the following:  - ONE slice of bread (or its equivalent, such as half of a hamburger bun).  - 1/2 cup of a "scoopable" starchy food such as potatoes or rice.  - 15 grams of carbohydrate as shown on food label.  3. Include at every meal: a protein food, a carb food, and vegetables and/or fruit.  - Obtain twice the volume of veg's as protein or carbohydrate foods for both lunch and dinner.  - Fresh or frozen veg's are best.  - Keep frozen veg's on hand for a quick vegetable serving.  4. Physical activity: At least 30 minutes 3 days a week.  - Record your minutes of exercise.  - Microbreaks: ~2 minutes activity for every 20 you sit or 5 min for every 60 min you sit.   See http://www.calorieking.com for info on carb  content of foods.

## 2015-08-29 NOTE — Progress Notes (Signed)
Medical Nutrition Therapy:  Appt start time: 1600 end time:  1700.  Assessment:  Primary concerns today: Weight management, Blood sugar control and hypertriglyceridemia.   Natalie Kelly feels she is doing better on her exercise, but she has managed 30 minutes of walking 3 times on most weeks.  SHe finds breakfast challenging to get in regularly, and especially following vacation a couple weeks ago.  She has been eating more veg's.  Other than during vacation and the week after, she has usually done well limiting starches to two per meal.  We talked re. yesterday's lunch choices, i.e., even though drinks offered were only sweet tea and lemonade, Natalie Kelly could have made the choice to go get some water.    24-hr recall:  (Up at 6:30 AM) B (9:30 AM)-  4 Belvita brkfast bars, apple, herb tea, water      Snk ( AM)-  --- L (12:30 PM)-  1 salad, lite drsng, 6 chx nuggets, 1 cc cookie, 10 oz lemonade Snk ( PM)-  1 pc dark choc, water D (7:30 PM)-  4 oz tofu, cucumb's, 1/2 c brn rice, 1 corn on cob, 1 t butter, herb tea, 2 1/3 t honey Snk ( PM)-  9 choc almonds, water Typical day? No. Breakfast has usually been earlier, something like whwht Eng muffin with pb and fruit, and lunch was brought in by a drug rep yesterday.    Progress Towards Goal(s):  In progress.   Nutritional Diagnosis:  Some progress on NI-5.8.3 Inappropriate intake of types of carbohydrates (specify):   As related to sweets and beverages.  As evidenced by yesterday's intake of sweet foods/beverages for both breakfast and lunch.    Intervention:  Nutrition Education.  Handouts given during visit include:  AVS  Goals sheet  Demonstrated degree of understanding via:  Teach Back  Barriers to learning/adherence to lifestyle change: Busy schedule makes planning and time for exercise difficult.   Monitoring/Evaluation:  Dietary intake, exercise, and body weight in 5 week(s).

## 2015-08-31 MED FILL — MELOXICAM 15 MG TABLET: 15 | 30 days supply | Qty: 30 | Fill #1

## 2015-09-04 DIAGNOSIS — Z6828 Body mass index (BMI) 28.0-28.9, adult: Secondary | ICD-10-CM | POA: Diagnosis not present

## 2015-09-04 DIAGNOSIS — Z1231 Encounter for screening mammogram for malignant neoplasm of breast: Secondary | ICD-10-CM | POA: Diagnosis not present

## 2015-09-04 DIAGNOSIS — Z01419 Encounter for gynecological examination (general) (routine) without abnormal findings: Secondary | ICD-10-CM | POA: Diagnosis not present

## 2015-09-06 MED FILL — TOPIRAMATE 50 MG TABLET: 50 | 90 days supply | Qty: 90 | Fill #3

## 2015-09-06 MED FILL — SYNTHROID 112 MCG TABLET: 112 | 90 days supply | Qty: 90 | Fill #2

## 2015-09-20 MED FILL — OGESTREL TABLET: 0.5-50 | 84 days supply | Qty: 84 | Fill #0

## 2015-09-26 ENCOUNTER — Encounter: Payer: Self-pay | Admitting: Podiatry

## 2015-09-26 ENCOUNTER — Ambulatory Visit (INDEPENDENT_AMBULATORY_CARE_PROVIDER_SITE_OTHER): Payer: 59 | Admitting: Podiatry

## 2015-09-26 DIAGNOSIS — M779 Enthesopathy, unspecified: Secondary | ICD-10-CM

## 2015-09-26 DIAGNOSIS — M205X1 Other deformities of toe(s) (acquired), right foot: Secondary | ICD-10-CM | POA: Diagnosis not present

## 2015-09-27 NOTE — Progress Notes (Signed)
She presents today for follow-up of her capsulitis second metatarsophalangeal joint of the right foot. She states that is doing much better proximally 90-95%. She continues all conservative therapies including oral medication and appropriate shoe gear.  Objective: Vital signs are stable she is alert and oriented 3 pulses remain palpable pain no swelling about the forefoot no erythema or cellulitis drainage or odor. She has good range of motion at the second metatarsophalangeal joint with mild tenderness on total end range of motion. She has dimpling around the second metatarsophalangeal joint with distraction of the toe and I am unable to feel any fluctuance within the joint.  Assessment: Well-healing capsulitis second metatarsophalangeal joint right foot.  Plan: Continue all conservative therapies including oral medication and appropriate shoe gear. I also dispensed carbon graphite insoles to assist her in her tennis shoes.

## 2015-10-03 ENCOUNTER — Encounter: Payer: Self-pay | Admitting: Family Medicine

## 2015-10-03 ENCOUNTER — Ambulatory Visit (INDEPENDENT_AMBULATORY_CARE_PROVIDER_SITE_OTHER): Payer: 59 | Admitting: Family Medicine

## 2015-10-03 DIAGNOSIS — E781 Pure hyperglyceridemia: Secondary | ICD-10-CM | POA: Diagnosis not present

## 2015-10-03 DIAGNOSIS — E663 Overweight: Secondary | ICD-10-CM | POA: Diagnosis not present

## 2015-10-03 NOTE — Patient Instructions (Addendum)
-   Exercise options besides outdoor exercise:   - Check out [facility] [phone]) no later than Sept 4.       - Membership options; are 13-YOs allowed on the floor?; hours of operation - Breakfast: Smoothie or yogurt with granola and fruit.   Goals: 1. Three real meals and at least 1 snack daily; no more than 5 hours without eating.   2. Record hours of sleep each night.      - Check out the Health app on your smart phone (or any other sleep tracking apps).   3. Walk at least 30 min 3 X wk.    Consider Weigh to Wellness II class, which starts Oct 10, and meets 6 Tuesdays from 5:30 to 6:30 PM at [facility].    Weigh to [employer], 2017:  1. Registration can be accessed at the following:   [email or web address]  2. Click on Weight Loss.   3. At the next screen, click on Weigh to Wellness II: Beyond Diets.    4. Now click on REGISTER ONLINE, and follow the instructions for the information and payment requested.  Be sure to indicate if you are a [facility] employee (and provide employee ID if you are).    5. You'll need to click on ADD TO CART followed by PLACE ORDER.

## 2015-10-03 NOTE — Progress Notes (Signed)
Medical Nutrition Therapy:  Appt start time: 1600 end time:  1700.  Assessment:  Primary concerns today: Weight management, Blood sugar control and hypertriglyceridemia.   Keiley said the past few weeks have been challenging, including sending her daughter to boarding school, which is stressful (some stress eating) to [place].  She has become more attentive to food labels, and to the amounts of carb in foods and drinks, which has influenced food choices.  She feels she's done well limiting starchy foods to 2 per meal; most challenging is getting 3 real meals per day.  Gracyn has managed exercising at least once a week, but has not met her goal of 3 X wk.    24-hr recall:  (Up at 6:30 AM; water ~7 AM) B (9:30 AM)-  3 bites of apple Snk ( AM)-  --- L (12:30 PM)-  3 T hummus, 6-7 Captain's Wafers, 5-6 corn chips, 3 chx wings, 1/2 c raw veg's, apple pie, water Snk ( PM)-  Few more bites of apple  D (8 PM)-  1 pc (soy) lasagna, 1/2 c blueberries, water Snk ( PM)-  --- Typical day? No. Has been getting breakfast (yogurt & granola/fruit or a pro shake) 3-4 X wk.  Also, lunch was an eclipse party yesterday, with foods brought in.  Usually lunch is salad or dinner leftovers from home, or food from cafeteria.  Dinners recently have been mostly on the run.    Progress Towards Goal(s):  In progress.   Nutritional Diagnosis:  Stable progress on NI-5.8.3 Inappropriate intake of types of carbohydrates (specify):   As related to sweets and beverages.  As evidenced by no sweet drinks recently and limited sweet foods.    Intervention:  Nutrition Education.  Handouts given during visit include:  AVS  Goals sheet (revised)   Demonstrated degree of understanding via:  Teach Back  Barriers to learning/adherence to lifestyle change: Busy schedule makes planning and time for exercise difficult.   Monitoring/Evaluation:  Dietary intake, exercise, and body weight in 8 week(s).

## 2015-10-04 MED FILL — MELOXICAM 15 MG TABLET: 15 | 30 days supply | Qty: 30 | Fill #2

## 2015-12-04 ENCOUNTER — Other Ambulatory Visit: Payer: 59 | Admitting: Internal Medicine

## 2015-12-04 ENCOUNTER — Other Ambulatory Visit: Payer: Self-pay | Admitting: Internal Medicine

## 2015-12-04 DIAGNOSIS — E039 Hypothyroidism, unspecified: Secondary | ICD-10-CM | POA: Diagnosis not present

## 2015-12-04 DIAGNOSIS — E785 Hyperlipidemia, unspecified: Secondary | ICD-10-CM | POA: Diagnosis not present

## 2015-12-04 DIAGNOSIS — Z0000 Encounter for general adult medical examination without abnormal findings: Secondary | ICD-10-CM

## 2015-12-04 LAB — CBC WITH DIFFERENTIAL/PLATELET
BASOS ABS: 94 {cells}/uL (ref 0–200)
Basophils Relative: 1 %
EOS PCT: 2 %
Eosinophils Absolute: 188 cells/uL (ref 15–500)
HCT: 39.7 % (ref 35.0–45.0)
Hemoglobin: 13.3 g/dL (ref 11.7–15.5)
LYMPHS PCT: 29 %
Lymphs Abs: 2726 cells/uL (ref 850–3900)
MCH: 27.5 pg (ref 27.0–33.0)
MCHC: 33.5 g/dL (ref 32.0–36.0)
MCV: 82.2 fL (ref 80.0–100.0)
MONOS PCT: 7 %
MPV: 9.5 fL (ref 7.5–12.5)
Monocytes Absolute: 658 cells/uL (ref 200–950)
NEUTROS ABS: 5734 {cells}/uL (ref 1500–7800)
Neutrophils Relative %: 61 %
PLATELETS: 403 10*3/uL — AB (ref 140–400)
RBC: 4.83 MIL/uL (ref 3.80–5.10)
RDW: 14.1 % (ref 11.0–15.0)
WBC: 9.4 10*3/uL (ref 3.8–10.8)

## 2015-12-04 LAB — TSH: TSH: 2.35 m[IU]/L

## 2015-12-05 LAB — COMPREHENSIVE METABOLIC PANEL
ALT: 9 U/L (ref 6–29)
AST: 12 U/L (ref 10–35)
Albumin: 3.9 g/dL (ref 3.6–5.1)
Alkaline Phosphatase: 47 U/L (ref 33–115)
BUN: 13 mg/dL (ref 7–25)
CHLORIDE: 105 mmol/L (ref 98–110)
CO2: 21 mmol/L (ref 20–31)
CREATININE: 0.84 mg/dL (ref 0.50–1.10)
Calcium: 9.4 mg/dL (ref 8.6–10.2)
GLUCOSE: 85 mg/dL (ref 65–99)
POTASSIUM: 4.5 mmol/L (ref 3.5–5.3)
SODIUM: 138 mmol/L (ref 135–146)
TOTAL PROTEIN: 7 g/dL (ref 6.1–8.1)
Total Bilirubin: 0.4 mg/dL (ref 0.2–1.2)

## 2015-12-05 LAB — LIPID PANEL
CHOL/HDL RATIO: 5.4 ratio — AB (ref <=5.0)
Cholesterol: 217 mg/dL — ABNORMAL HIGH (ref 125–200)
HDL: 40 mg/dL — ABNORMAL LOW (ref >=46)
LDL CALC: 140 mg/dL — AB (ref <130)
Triglycerides: 185 mg/dL — ABNORMAL HIGH (ref <150)
VLDL: 37 mg/dL — AB (ref <30)

## 2015-12-05 LAB — VITAMIN D 25 HYDROXY (VIT D DEFICIENCY, FRACTURES): VIT D 25 HYDROXY: 37 ng/mL (ref 30–100)

## 2015-12-08 ENCOUNTER — Encounter: Payer: Self-pay | Admitting: Internal Medicine

## 2015-12-08 ENCOUNTER — Ambulatory Visit (INDEPENDENT_AMBULATORY_CARE_PROVIDER_SITE_OTHER): Payer: 59 | Admitting: Internal Medicine

## 2015-12-08 VITALS — BP 110/80 | HR 98 | Temp 98.6°F | Ht 63.25 in | Wt 164.0 lb

## 2015-12-08 DIAGNOSIS — E663 Overweight: Secondary | ICD-10-CM | POA: Diagnosis not present

## 2015-12-08 DIAGNOSIS — Z8669 Personal history of other diseases of the nervous system and sense organs: Secondary | ICD-10-CM

## 2015-12-08 DIAGNOSIS — Z0000 Encounter for general adult medical examination without abnormal findings: Secondary | ICD-10-CM

## 2015-12-08 DIAGNOSIS — E782 Mixed hyperlipidemia: Secondary | ICD-10-CM | POA: Diagnosis not present

## 2015-12-08 DIAGNOSIS — R7302 Impaired glucose tolerance (oral): Secondary | ICD-10-CM | POA: Diagnosis not present

## 2015-12-08 DIAGNOSIS — E039 Hypothyroidism, unspecified: Secondary | ICD-10-CM | POA: Diagnosis not present

## 2015-12-08 LAB — POCT URINALYSIS DIPSTICK
Bilirubin, UA: NEGATIVE
GLUCOSE UA: NEGATIVE
Ketones, UA: NEGATIVE
Leukocytes, UA: NEGATIVE
NITRITE UA: NEGATIVE
PROTEIN UA: NEGATIVE
SPEC GRAV UA: 1.01
UROBILINOGEN UA: 0.2
pH, UA: 6.5

## 2015-12-08 MED ORDER — SUMATRIPTAN 20 MG/ACT NA SOLN: NASAL | 3 refills | Status: DC

## 2015-12-08 MED ORDER — TOPIRAMATE 50 MG PO TABS: 50.0000 mg | ORAL_TABLET | Freq: Every day | ORAL | 3 refills | Status: DC

## 2015-12-08 MED ORDER — NEOMYCIN-POLYMYXIN-HC 1 % OT SOLN: OTIC | 1 refills | Status: DC

## 2015-12-08 MED ORDER — BUTALBITAL-APAP-CAFFEINE 50-325-40 MG PO TABS: 1.0000 | ORAL_TABLET | Freq: Two times a day (BID) | ORAL | 2 refills | Status: DC | PRN

## 2015-12-08 MED ORDER — MONTELUKAST SODIUM 10 MG PO TABS: ORAL_TABLET | ORAL | 5 refills | Status: DC

## 2015-12-08 MED ORDER — LEVOTHYROXINE SODIUM 112 MCG PO TABS: 112.0000 ug | ORAL_TABLET | Freq: Every day | ORAL | 1 refills | Status: DC

## 2015-12-08 MED FILL — TOPIRAMATE 50 MG TABLET: 50 | 90 days supply | Qty: 90 | Fill #0

## 2015-12-08 MED FILL — SYNTHROID 112 MCG TABLET: 112 | 90 days supply | Qty: 90 | Fill #0

## 2015-12-08 MED FILL — SUMAtriptan 20 MG/ACT SOLN: 20 | 30 days supply | Qty: 6 | Fill #0

## 2015-12-08 NOTE — Progress Notes (Signed)
Subjective:    Patient ID: Natalie Kelly, female    DOB: 29-Aug-1970, 45 y.o.   MRN: [id number]  HPI  45 year old White Female for health maintenance exam. Trying to be consistent with diet and exercise.Has seen Alease Medina, Registered Dietitian for dietary counseling. Main issue has been consistent with diet and exercise.  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 She has to drive her son to school near [facility] as well. She doesn't have to use Imitrex very often for migraines. Uses Fioricet more than Imitrex.  History of hypothyroidism.She is on thyroid replacement therapy. She has seen Dr. Loanne Drilling in the past regarding hypothyroidism and he concurred treating her with thyroid replacement.  Fasting labs reviewed with her. Total cholesterol is 217 and previously was 208. Triglycerides are down from 205 to185. LDL cholesterol has increased from 129-140. Hyperlipidemia was diagnosed in 2010. She does not want to be on statin medication. In August 2015 she was 165 pounds. Now weighs 164 pounds.  History of seborrhea in the ears treated with Cortisporin otic suspension on when necessary basis.  Social history: She is married. Does not smoke or consume alcohol. 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  Family history: Father with history of stroke, hypertension, headaches. Mother with history of headaches. One brother with history of headaches.  Dr. Radene Knee is GYN physician.  History of right patella fracture 2008.  Hemoglobin A1c added.  Received flu vaccine through employment.  May need tetanus immunization update. She will check with employee health to see if they will provide it.    Review of Systems  no new complaints. Has some occult blood on urine dipstick but is beginning to show signs of starting menstrual period     Objective:   Physical Exam  Constitutional: She is oriented to person, place, and time. She appears  well-developed and well-nourished. No distress.  HENT:  Head: Normocephalic and atraumatic.  Right Ear: External ear normal.  Left Ear: External ear normal.  Mouth/Throat: Oropharynx is clear and moist. No oropharyngeal exudate.  Eyes: Conjunctivae and EOM are normal. Pupils are equal, round, and reactive to light. Right eye exhibits no discharge. Left eye exhibits no discharge. No scleral icterus.  Neck: Neck supple. No JVD present. No thyromegaly present.  Cardiovascular: Normal rate, regular rhythm, normal heart sounds and intact distal pulses.   No murmur heard. Breasts normal female without masses  Pulmonary/Chest: Effort normal and breath sounds normal. No respiratory distress. She has no wheezes. She has no rales.  Abdominal: Soft. Bowel sounds are normal. She exhibits no distension and no mass. There is no tenderness. There is no rebound and no guarding.  Genitourinary:  Genitourinary Comments: Deferred to GYN  Musculoskeletal: She exhibits no edema.  Lymphadenopathy:    She has no cervical adenopathy.  Neurological: She is alert and oriented to person, place, and time. She has normal reflexes. No cranial nerve deficit. Coordination normal.  Skin: Skin is warm and dry. No rash noted. She is not diaphoretic.  Psychiatric: She has a normal mood and affect. Her behavior is normal. Judgment and thought content normal.  Vitals reviewed.         Assessment & Plan:  History of migraine headaches--Stable on current regimen  Hypothyroidism stable with thyroid replacement. TSH 2.35  Hyperlipidemia some improvement in triglycerides. LDL has increased slightly.   Impaired glucose tolerance-hemoglobin A1c pending. Previous hemoglobin A1c 5.9% April  2017.  Obesity-encouraged diet exercise and weight loss  Plan: Return in 6 months for office visit, lipid panel, hemoglobin A1c. Patient to check with employee health regarding tetanus immunization update

## 2015-12-08 NOTE — Patient Instructions (Signed)
Continue diet exercise and weight loss efforts. Continue same medications. Return in 6 months. It was a pleasure to see you today. Please check with employee health about tetanus update

## 2015-12-09 LAB — HEMOGLOBIN A1C
HEMOGLOBIN A1C: 5.5 % (ref <5.7)
MEAN PLASMA GLUCOSE: 111 mg/dL

## 2015-12-09 LAB — MICROALBUMIN / CREATININE URINE RATIO
CREATININE, URINE: 185 mg/dL (ref 20–320)
MICROALB UR: 0.7 mg/dL
MICROALB/CREAT RATIO: 4 ug/mg{creat} (ref <30)

## 2015-12-12 ENCOUNTER — Encounter: Payer: Self-pay | Admitting: Family Medicine

## 2015-12-12 ENCOUNTER — Ambulatory Visit (INDEPENDENT_AMBULATORY_CARE_PROVIDER_SITE_OTHER): Payer: 59 | Admitting: Family Medicine

## 2015-12-12 DIAGNOSIS — E781 Pure hyperglyceridemia: Secondary | ICD-10-CM | POA: Diagnosis not present

## 2015-12-12 DIAGNOSIS — E663 Overweight: Secondary | ICD-10-CM

## 2015-12-12 NOTE — Progress Notes (Signed)
Medical Nutrition Therapy:  Appt start time: 1600 end time:  1700.  Assessment:  Primary concerns today: Weight management, Blood sugar control and hypertriglyceridemia.   Natalie Kelly's recent A1C was down to 5.5.  Her HDL was 40, and LDL was still higher than desired, 140.  She is finding consistent exercise to be her biggest challenge, although breakfast is still difficult for her, too.  She forgot to bring her goals sheet, which she said shows her success in meeting goals to be erratic.    Natalie Kelly is averaging 6.5-7.5 hrs of sleep per night.    24-hr recall:  (Up at 6:30 AM; water with med's) B (10 AM)-  1 pkt inst coconut steel-cut oats, unswt tea Snk ( AM)-  --- L (1 PM)-  1 1/2 c veg lasagne, salad, drsng, unswt tea, 1/2 breadstick  Snk (4 PM)-  Belvita biscuit with almond filling Snk (7:30)-  2 slc beef jerkey, 2 Captain's Wafers D (8 PM)-  1 c rice, 2/3 c blk-eyed peas, unswt tea Snk (9 PM)-  1 pc pumpkin caramel, 1 miniature Hershey bar Typical day? Yes.  Dinner is usually earlier, and Halloween candy is not usually around.    Progress Towards Goal(s):  In progress.   Nutritional Diagnosis:  Some regression on NI-5.8.3 Inappropriate intake of types of carbohydrates (specify):   As related to sweets and beverages.  As evidenced by some (limited) intake of sweets with Halloween candy around.    Intervention:  Nutrition Education.  Handouts given during visit include:  AVS  Goals sheet (revised)   Demonstrated degree of understanding via:  Teach Back  Barriers to learning/adherence to lifestyle change: Busy schedule makes planning and time for exercise difficult.   Monitoring/Evaluation:  Dietary intake, exercise, and body weight in 8 week(s).

## 2015-12-12 NOTE — Patient Instructions (Addendum)
-   Consideration for Halloween next year:  Sugarless gum!  - Make a plan and set a date by which you will get a piece of fitness equipment into the garage.    - Meanwhile, commit to your walking on Friday after work, and on both Saturday and Sunday.    - Continue to complete your Goals Sheet.    - When you review each week's exercise, look ahead to the next week:  Think about what days you think it's likely for you to fit in the walking.  For example, if something is scheduled for the weekend, how will you fit your walking in around this?  - You may actually have more success getting a walk in after work EARLY in the week OR walk during your lunch hour.  A benefit of walking after any meal is that you better control your blood sugar and triglycerides.    - Consider the help of your coworkers in getting walking done.    - Make sure you think about optimizing the carb's you choose, i.e., go for more fiber vs. refined carbs (Triscuits vs. Len Blalock).  The less refined carbs (and total carb) you eat, the lower your TG are likely to be.      - Other breakfast ideas: Yogurt and fruit; English muffin; trail mix (nuts, seeds, and dried fruit) with some whole-grain cereal pieces or fresh fruit.    - Listen to the podcast from [facility] Oct 16 program, "Why We Sleep."    - Your next NUTR appt is on Jan 9 at 4 PM.

## 2015-12-14 MED FILL — OGESTREL TABLET: 0.5-50 | 84 days supply | Qty: 84 | Fill #1

## 2016-02-20 ENCOUNTER — Ambulatory Visit (INDEPENDENT_AMBULATORY_CARE_PROVIDER_SITE_OTHER): Payer: 59 | Admitting: Family Medicine

## 2016-02-20 ENCOUNTER — Encounter: Payer: Self-pay | Admitting: Family Medicine

## 2016-02-20 DIAGNOSIS — E663 Overweight: Secondary | ICD-10-CM | POA: Diagnosis not present

## 2016-02-20 DIAGNOSIS — E781 Pure hyperglyceridemia: Secondary | ICD-10-CM | POA: Diagnosis not present

## 2016-02-20 NOTE — Patient Instructions (Signed)
Goals: 1. Get vegetables at both lunch and dinner.     2. Bedtime no later than 10:30 PM.      3. Walk at least 30 min 3 X wk.    Remember to plan ahead to make sure you have the right foods on hand for meals and snacks.    KEEP FROZEN VEG'S ON HAND AT ALL TIMES!  Continue to complete goals sheet.  Give me a call after your labs in April, and we'll determine follow-up then.

## 2016-02-20 NOTE — Progress Notes (Signed)
Medical Nutrition Therapy:  Appt start time: 1600 end time:  1700.  Assessment:  Primary concerns today: Weight management, Blood sugar control and hypertriglyceridemia.   Panayiota said breakfast is still a challenge for her.  She has been getting 6.5-7.5 hours of sleep still.  Also still a problem fitting in exercise.  Her parents have a bike and a TM in their home, so she sometimes uses these when there.  She has an agreement with her co-workers to walk at lunchtime if it is above 50 degrees.    24-hr recall:  (Up at 6:30 AM; water) B (10 AM)-  1 c herb tea, 1/2 apple Snk ( AM)-  water L (1 PM)-  1 salad, 2 1/2 oz tuna, Captain's Wafers, 1 orange, water Snk ( PM)-   D (7 PM)-  1 c baked ziti (l-f cot chs & mozzarella), 1 slc brd, 1 tsp butter, herb tea Snk (9 PM)-  1/2 c ice cream Typical day? Yes.  except for breakfast; is usually getting breakfast only ~2 X wk.    Progress Towards Goal(s):  In progress.   Nutritional Diagnosis:  Progress noted on NI-5.8.3 Inappropriate intake of types of carbohydrates (specify):   As related to sweets and beverages.  As evidenced by self-report of only moderate amount of sweets consumed recently.    Intervention:  Nutrition Education.  Handouts given during visit include:  AVS  Goals sheet (revised)   Demonstrated degree of understanding via:  Teach Back  Barriers to learning/adherence to lifestyle change: Busy schedule makes planning and time for exercise difficult.   Monitoring/Evaluation:  Dietary intake, exercise, and body weight prn.  Pnina will be in touch following next set of labs in April.

## 2016-03-07 MED FILL — OGESTREL TABLET: 0.5-50 | 84 days supply | Qty: 84 | Fill #2

## 2016-03-07 MED FILL — SYNTHROID 112 MCG TABLET: 112 | 90 days supply | Qty: 90 | Fill #1

## 2016-03-07 MED FILL — TOPIRAMATE 50 MG TABLET: 50 | 90 days supply | Qty: 90 | Fill #1

## 2016-05-30 MED FILL — OGESTREL TABLET: 0.5-50 | 84 days supply | Qty: 84 | Fill #3

## 2016-06-03 ENCOUNTER — Other Ambulatory Visit: Payer: 59 | Admitting: Internal Medicine

## 2016-06-03 DIAGNOSIS — E785 Hyperlipidemia, unspecified: Secondary | ICD-10-CM

## 2016-06-03 DIAGNOSIS — R7302 Impaired glucose tolerance (oral): Secondary | ICD-10-CM

## 2016-06-03 LAB — LIPID PANEL
CHOL/HDL RATIO: 4.7 ratio (ref <5.0)
Cholesterol: 203 mg/dL — ABNORMAL HIGH (ref <200)
HDL: 43 mg/dL — ABNORMAL LOW (ref >50)
LDL Cholesterol: 121 mg/dL — ABNORMAL HIGH (ref <100)
TRIGLYCERIDES: 194 mg/dL — AB (ref <150)
VLDL: 39 mg/dL — ABNORMAL HIGH (ref <30)

## 2016-06-04 LAB — HEMOGLOBIN A1C
Hgb A1c MFr Bld: 5.4 % (ref <5.7)
Mean Plasma Glucose: 108 mg/dL

## 2016-06-04 LAB — MICROALBUMIN / CREATININE URINE RATIO
CREATININE, URINE: 233 mg/dL (ref 20–320)
MICROALB UR: 0.6 mg/dL
Microalb Creat Ratio: 3 mcg/mg creat (ref <30)

## 2016-06-06 ENCOUNTER — Encounter: Payer: Self-pay | Admitting: Internal Medicine

## 2016-06-06 ENCOUNTER — Ambulatory Visit (INDEPENDENT_AMBULATORY_CARE_PROVIDER_SITE_OTHER): Payer: 59 | Admitting: Internal Medicine

## 2016-06-06 VITALS — BP 110/82 | HR 72 | Temp 99.4°F | Ht 64.0 in | Wt 167.0 lb

## 2016-06-06 DIAGNOSIS — E039 Hypothyroidism, unspecified: Secondary | ICD-10-CM

## 2016-06-06 DIAGNOSIS — E782 Mixed hyperlipidemia: Secondary | ICD-10-CM

## 2016-06-06 DIAGNOSIS — Z8669 Personal history of other diseases of the nervous system and sense organs: Secondary | ICD-10-CM

## 2016-06-06 DIAGNOSIS — Z87898 Personal history of other specified conditions: Secondary | ICD-10-CM | POA: Diagnosis not present

## 2016-06-07 ENCOUNTER — Other Ambulatory Visit: Payer: Self-pay | Admitting: Internal Medicine

## 2016-06-07 MED FILL — SYNTHROID 112 MCG TABLET: 112 | 90 days supply | Qty: 90 | Fill #0

## 2016-06-07 MED FILL — TOPIRAMATE 50 MG TABLET: 50 | 90 days supply | Qty: 90 | Fill #2

## 2016-06-08 NOTE — Patient Instructions (Signed)
It was a pleasure to see you today. Continue diet and exercise regimen return for physical exam in 6 months.

## 2016-06-08 NOTE — Progress Notes (Signed)
   Subjective:    Patient ID: Natalie Kelly, female    DOB: [birth year], 46 y.o.   MRN: [id number]  HPI She met a couple times since October with Dr. Alease Medina, dietitian. She found that to be helpful. In April 2017 her hemoglobin was 5.9% and now is 5.4%. Lipid panel 6 months ago showed a total cholesterol of 217 is now 203. Triglycerides remain elevated at 194 and previously were 185. LDL cholesterol has decreased from 140-121. She would like to continue with diet and exercise and not be on more medication. She is also on Synthroid for hypothyroidism 0.112 mg daily. She has a history of migraine headaches. History of allergic rhinitis.    Review of Systems     Objective:   Physical Exam  Not examined. Spent 20 minutes speaking with her about these issues.      Assessment & Plan:  Hyperlipidemia  History of impaired glucose tolerance now with normal hemoglobin A1c just with diet alone  Hypothyroidism  Plan: Continue diet and exercise regimen and same dose of thyroid replacement. Return in 6 months for physical exam.

## 2016-08-22 MED FILL — OGESTREL TABLET: 0.5-50 | 84 days supply | Qty: 84 | Fill #4

## 2016-09-09 MED FILL — TOPIRAMATE 50 MG TABLET: 50 | 90 days supply | Qty: 90 | Fill #3

## 2016-09-09 MED FILL — SYNTHROID 112 MCG TABLET: 112 | 90 days supply | Qty: 90 | Fill #1

## 2016-09-24 DIAGNOSIS — Z6829 Body mass index (BMI) 29.0-29.9, adult: Secondary | ICD-10-CM | POA: Diagnosis not present

## 2016-09-24 DIAGNOSIS — Z01419 Encounter for gynecological examination (general) (routine) without abnormal findings: Secondary | ICD-10-CM | POA: Diagnosis not present

## 2016-09-24 DIAGNOSIS — Z1231 Encounter for screening mammogram for malignant neoplasm of breast: Secondary | ICD-10-CM | POA: Diagnosis not present

## 2016-11-14 MED FILL — OGESTREL TABLET: 0.5-50 | 84 days supply | Qty: 84 | Fill #0

## 2016-12-10 ENCOUNTER — Other Ambulatory Visit: Payer: 59 | Admitting: Internal Medicine

## 2016-12-10 DIAGNOSIS — E039 Hypothyroidism, unspecified: Secondary | ICD-10-CM | POA: Diagnosis not present

## 2016-12-10 DIAGNOSIS — R7302 Impaired glucose tolerance (oral): Secondary | ICD-10-CM | POA: Diagnosis not present

## 2016-12-10 DIAGNOSIS — Z0000 Encounter for general adult medical examination without abnormal findings: Secondary | ICD-10-CM | POA: Diagnosis not present

## 2016-12-10 DIAGNOSIS — E785 Hyperlipidemia, unspecified: Secondary | ICD-10-CM | POA: Diagnosis not present

## 2016-12-10 DIAGNOSIS — Z1321 Encounter for screening for nutritional disorder: Secondary | ICD-10-CM

## 2016-12-11 ENCOUNTER — Other Ambulatory Visit: Payer: Self-pay | Admitting: Internal Medicine

## 2016-12-11 LAB — CBC WITH DIFFERENTIAL/PLATELET
BASOS ABS: 100 {cells}/uL (ref 0–200)
Basophils Relative: 1.2 %
EOS ABS: 183 {cells}/uL (ref 15–500)
Eosinophils Relative: 2.2 %
HEMATOCRIT: 40.1 % (ref 35.0–45.0)
HEMOGLOBIN: 13.5 g/dL (ref 11.7–15.5)
LYMPHS ABS: 2390 {cells}/uL (ref 850–3900)
MCH: 27.4 pg (ref 27.0–33.0)
MCHC: 33.7 g/dL (ref 32.0–36.0)
MCV: 81.3 fL (ref 80.0–100.0)
MPV: 10 fL (ref 7.5–12.5)
Monocytes Relative: 8.4 %
NEUTROS ABS: 4930 {cells}/uL (ref 1500–7800)
NEUTROS PCT: 59.4 %
Platelets: 416 10*3/uL — ABNORMAL HIGH (ref 140–400)
RBC: 4.93 10*6/uL (ref 3.80–5.10)
RDW: 12.8 % (ref 11.0–15.0)
Total Lymphocyte: 28.8 %
WBC: 8.3 10*3/uL (ref 3.8–10.8)
WBCMIX: 697 {cells}/uL (ref 200–950)

## 2016-12-11 LAB — COMPLETE METABOLIC PANEL WITH GFR
AG RATIO: 1.3 (calc) (ref 1.0–2.5)
ALBUMIN MSPROF: 4.1 g/dL (ref 3.6–5.1)
ALKALINE PHOSPHATASE (APISO): 46 U/L (ref 33–115)
ALT: 14 U/L (ref 6–29)
AST: 17 U/L (ref 10–35)
BILIRUBIN TOTAL: 0.3 mg/dL (ref 0.2–1.2)
BUN: 14 mg/dL (ref 7–25)
CO2: 20 mmol/L (ref 20–32)
CREATININE: 0.81 mg/dL (ref 0.50–1.10)
Calcium: 9.4 mg/dL (ref 8.6–10.2)
Chloride: 109 mmol/L (ref 98–110)
GFR, Est African American: 100 mL/min/{1.73_m2} (ref >=60)
GFR, Est Non African American: 86 mL/min/{1.73_m2} (ref >=60)
GLOBULIN: 3.1 g/dL (ref 1.9–3.7)
Glucose, Bld: 87 mg/dL (ref 65–99)
POTASSIUM: 4.8 mmol/L (ref 3.5–5.3)
SODIUM: 140 mmol/L (ref 135–146)
Total Protein: 7.2 g/dL (ref 6.1–8.1)

## 2016-12-11 LAB — MICROALBUMIN / CREATININE URINE RATIO
Creatinine, Urine: 179 mg/dL (ref 20–275)
Microalb Creat Ratio: 3 mcg/mg creat (ref <30)
Microalb, Ur: 0.6 mg/dL

## 2016-12-11 LAB — LIPID PANEL
CHOLESTEROL: 241 mg/dL — AB (ref <200)
HDL: 49 mg/dL — AB (ref >50)
LDL Cholesterol (Calc): 159 mg/dL (calc) — ABNORMAL HIGH
Non-HDL Cholesterol (Calc): 192 mg/dL (calc) — ABNORMAL HIGH (ref <130)
Total CHOL/HDL Ratio: 4.9 (calc) (ref <5.0)
Triglycerides: 178 mg/dL — ABNORMAL HIGH (ref <150)

## 2016-12-11 LAB — VITAMIN D 25 HYDROXY (VIT D DEFICIENCY, FRACTURES): Vit D, 25-Hydroxy: 37 ng/mL (ref 30–100)

## 2016-12-11 LAB — HEMOGLOBIN A1C
HEMOGLOBIN A1C: 5.6 %{Hb} (ref <5.7)
MEAN PLASMA GLUCOSE: 114 (calc)
eAG (mmol/L): 6.3 (calc)

## 2016-12-11 LAB — TSH: TSH: 2.5 mIU/L

## 2016-12-11 MED FILL — TOPIRAMATE 50 MG TABLET: 50 | 90 days supply | Qty: 90 | Fill #0

## 2016-12-12 ENCOUNTER — Ambulatory Visit (INDEPENDENT_AMBULATORY_CARE_PROVIDER_SITE_OTHER): Payer: 59 | Admitting: Internal Medicine

## 2016-12-12 ENCOUNTER — Encounter: Payer: Self-pay | Admitting: Internal Medicine

## 2016-12-12 VITALS — BP 120/82 | HR 80 | Temp 98.9°F | Ht 63.25 in | Wt 173.0 lb

## 2016-12-12 DIAGNOSIS — Z0000 Encounter for general adult medical examination without abnormal findings: Secondary | ICD-10-CM | POA: Diagnosis not present

## 2016-12-12 DIAGNOSIS — Z6830 Body mass index (BMI) 30.0-30.9, adult: Secondary | ICD-10-CM

## 2016-12-12 DIAGNOSIS — E782 Mixed hyperlipidemia: Secondary | ICD-10-CM

## 2016-12-12 DIAGNOSIS — Z8669 Personal history of other diseases of the nervous system and sense organs: Secondary | ICD-10-CM | POA: Diagnosis not present

## 2016-12-12 DIAGNOSIS — E039 Hypothyroidism, unspecified: Secondary | ICD-10-CM

## 2016-12-12 DIAGNOSIS — Z87898 Personal history of other specified conditions: Secondary | ICD-10-CM | POA: Diagnosis not present

## 2016-12-12 LAB — POCT URINALYSIS DIPSTICK
Bilirubin, UA: NEGATIVE
Glucose, UA: NEGATIVE
Ketones, UA: NEGATIVE
Leukocytes, UA: NEGATIVE
NITRITE UA: NEGATIVE
PH UA: 6.5 (ref 5.0–8.0)
PROTEIN UA: NEGATIVE
SPEC GRAV UA: 1.025 (ref 1.010–1.025)
UROBILINOGEN UA: 0.2 U/dL

## 2016-12-12 MED ORDER — SUMATRIPTAN 20 MG/ACT NA SOLN: NASAL | 3 refills | Status: DC

## 2016-12-12 MED ORDER — SYNTHROID 112 MCG PO TABS: 112.0000 ug | ORAL_TABLET | Freq: Every day | ORAL | 1 refills | Status: DC

## 2016-12-12 MED ORDER — ROSUVASTATIN CALCIUM 5 MG PO TABS: 5.0000 mg | ORAL_TABLET | Freq: Every day | ORAL | 3 refills | Status: DC

## 2016-12-12 MED FILL — SYNTHROID 112 MCG TABLET: 112 | 90 days supply | Qty: 90 | Fill #0

## 2016-12-12 MED FILL — ROSUVASTATIN CALCIUM 5 MG T: 5 | 90 days supply | Qty: 90 | Fill #0

## 2016-12-12 NOTE — Progress Notes (Signed)
Subjective:    Patient ID: Natalie Kelly, female    DOB: [birth year], 46 y.o.   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  She has a history of hypothyroidism, hyperlipidemia, obesity and migraine headaches.  Has seen Alease Medina, dietitian in the past.  Main issue has been consistency with diet and exercise.  For migraine headaches uses Imitrex and Fioricet.  History of hypothyroidism on thyroid replacement therapy.  Has seen Dr. Loanne Drilling in the past regarding hypothyroidism and he concurred with treating her with thyroid replacement medication.  Hyperlipidemia was diagnosed in 2010.  She does not want to be on statin medication.  In August 2015 she was 165 pounds.  In 2017 weight 164 pounds and now weighs 173 pounds.  BMI is 30.40  History of seborrhea in the ears treated with as needed Cortisporin otic suspension.  Dr. Radene Knee is GYN physician.  History of right patellar fracture 2008.  Received flu vaccine through employment.  Social history: She is married.  Does not smoke or consume alcohol.  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  Family history: Father with history of stroke, hypertension, headaches.  Mother with history of headaches.  One brother with history of headaches.     Review of Systems  Constitutional: Positive for fatigue.  Respiratory: Negative.   Cardiovascular: Negative.   Gastrointestinal: Negative.   Genitourinary: Negative.   Neurological:       History of migraine headaches       Objective:   Physical Exam  Constitutional: She is oriented to person, place, and time. She appears well-developed and well-nourished. No distress.  HENT:  Head: Normocephalic and atraumatic.  Left Ear: External ear normal.  Mouth/Throat: Oropharynx is clear and moist. No oropharyngeal  exudate.  Eyes: Conjunctivae and EOM are normal. Pupils are equal, round, and reactive to light. Right eye exhibits no discharge. Left eye exhibits no discharge. No scleral icterus.  Neck: Neck supple. No JVD present. No thyromegaly present.  Cardiovascular: Normal rate, regular rhythm, normal heart sounds and intact distal pulses.  No murmur heard. Pulmonary/Chest: Effort normal and breath sounds normal. No respiratory distress. She has no wheezes. She has no rales.  Abdominal: Soft. Bowel sounds are normal. She exhibits no distension and no mass. There is no tenderness. There is no rebound and no guarding.  Genitourinary:  Genitourinary Comments: Deferred to Dr. Radene Knee  Musculoskeletal: She exhibits no edema.  Lymphadenopathy:    She has no cervical adenopathy.  Neurological: She is alert and oriented to person, place, and time. She has normal reflexes. No cranial nerve deficit.  Skin: Skin is warm and dry. No rash noted. She is not diaphoretic.  Psychiatric: She has a normal mood and affect. Her behavior is normal. Judgment and thought content normal.  Vitals reviewed.         Assessment & Plan:  Hypothyroidism-stable on current dose of thyroid replacement  Hyperlipidemia-total cholesterol 241 and 6 months ago was 203.  She does not want to be on statin medication.  Triglycerides 178 and previously was 194 6 months ago.  LDL cholesterol 159 and was previously 121 6 months ago.  Recheck in 6 months  BMI 30.40 has had weight gain 9 pounds in past year.  Needs to recommit to diet exercise and weight  loss.  History of migraine headaches  History of impaired glucose tolerance with hemoglobin A1c 5.9% April 2017.  Recent hemoglobin A1c 5.6% and stable  Plan: She should return in 6 months for weight check and fasting lipid panel along with TSH.  Encourage diet exercise and weight loss.

## 2016-12-28 ENCOUNTER — Encounter: Payer: Self-pay | Admitting: Internal Medicine

## 2016-12-28 NOTE — Patient Instructions (Signed)
It was a pleasure to see you today.  Please work on diet exercise and weight loss which would help your lipid issue.  Continue same medications and return in 6 months.

## 2017-02-07 MED FILL — OGESTREL TABLET: 0.5-50 | 84 days supply | Qty: 84 | Fill #1

## 2017-02-19 ENCOUNTER — Other Ambulatory Visit: Payer: Self-pay | Admitting: Internal Medicine

## 2017-02-19 DIAGNOSIS — Z79899 Other long term (current) drug therapy: Secondary | ICD-10-CM

## 2017-02-19 DIAGNOSIS — E785 Hyperlipidemia, unspecified: Secondary | ICD-10-CM

## 2017-03-10 ENCOUNTER — Other Ambulatory Visit: Payer: 59 | Admitting: Internal Medicine

## 2017-03-10 DIAGNOSIS — Z79899 Other long term (current) drug therapy: Secondary | ICD-10-CM

## 2017-03-10 DIAGNOSIS — E785 Hyperlipidemia, unspecified: Secondary | ICD-10-CM

## 2017-03-10 LAB — HEPATIC FUNCTION PANEL
AG Ratio: 1.4 (calc) (ref 1.0–2.5)
ALKALINE PHOSPHATASE (APISO): 56 U/L (ref 33–115)
ALT: 61 U/L — AB (ref 6–29)
AST: 52 U/L — ABNORMAL HIGH (ref 10–35)
Albumin: 4.3 g/dL (ref 3.6–5.1)
BILIRUBIN INDIRECT: 0.3 mg/dL (ref 0.2–1.2)
Bilirubin, Direct: 0.1 mg/dL (ref 0.0–0.2)
Globulin: 3 g/dL (calc) (ref 1.9–3.7)
TOTAL PROTEIN: 7.3 g/dL (ref 6.1–8.1)
Total Bilirubin: 0.4 mg/dL (ref 0.2–1.2)

## 2017-03-10 LAB — LIPID PANEL
Cholesterol: 153 mg/dL (ref <200)
HDL: 55 mg/dL (ref >50)
LDL CHOLESTEROL (CALC): 82 mg/dL
NON-HDL CHOLESTEROL (CALC): 98 mg/dL (ref <130)
TRIGLYCERIDES: 82 mg/dL (ref <150)
Total CHOL/HDL Ratio: 2.8 (calc) (ref <5.0)

## 2017-03-12 MED FILL — TOPIRAMATE 50 MG TABLET: 50 | 90 days supply | Qty: 90 | Fill #1

## 2017-03-12 MED FILL — ROSUVASTATIN CALCIUM 5 MG T: 5 | 90 days supply | Qty: 90 | Fill #1

## 2017-03-13 ENCOUNTER — Ambulatory Visit (INDEPENDENT_AMBULATORY_CARE_PROVIDER_SITE_OTHER): Payer: 59 | Admitting: Internal Medicine

## 2017-03-13 ENCOUNTER — Encounter: Payer: Self-pay | Admitting: Internal Medicine

## 2017-03-13 VITALS — BP 110/72 | HR 77 | Wt 178.0 lb

## 2017-03-13 DIAGNOSIS — E782 Mixed hyperlipidemia: Secondary | ICD-10-CM

## 2017-03-13 DIAGNOSIS — E039 Hypothyroidism, unspecified: Secondary | ICD-10-CM | POA: Diagnosis not present

## 2017-03-13 DIAGNOSIS — R7989 Other specified abnormal findings of blood chemistry: Secondary | ICD-10-CM

## 2017-03-13 DIAGNOSIS — R945 Abnormal results of liver function studies: Secondary | ICD-10-CM

## 2017-03-13 DIAGNOSIS — Z6831 Body mass index (BMI) 31.0-31.9, adult: Secondary | ICD-10-CM

## 2017-03-13 MED FILL — SYNTHROID 112 MCG TABLET: 112 | 90 days supply | Qty: 90 | Fill #1

## 2017-03-13 NOTE — Progress Notes (Signed)
   Subjective:    Patient ID: Natalie Kelly, female    DOB: Jan 15, 1971, 47 y.o.   MRN: [id number]  HPI 47 year old Female for follow up of hyperlipidemia.  She is now on Crestor 5 mg daily.  Her lipid panel has normalized.  However she has mild elevation of SGOT and SGPT since starting Crestor.  SGOT is 52 and SGPT is 61.  I do not know if it is fatty liver.  She denies recent viral infection.  Does not consume alcohol.  It is unusual to see elevation with low-dose Crestor.  Total cholesterol has decreased from 241 to 153.  Triglycerides have decreased from 178 to 82.   LDL has decreased from 159 to 82.  We have agreed that she will try Crestor 5 mg  3 times a week and follow-up in 8 weeks with office visit and liver panel as well as lipid panel.  If liver functions remain elevated, we will consider ultrasound of the liver.  However, I think this may be fatty liver.  Her BMI is 31.28.  Her weight is 178 pounds.  She is gained 5 pounds since her physical exam early November.  We talked about diet exercise and weight loss.  She could perhaps go use her mother's treadmill and bicycle 2-3 times a week.  She really works very long hours and concerned and is tired and eats and basically goes to bed.  We talked about changing back to generic thyroid replacement.  I would prefer that she stay with Synthroid at present time.      Review of Systems     Objective:   Physical Exam  Not examined.  Spent 15-20 minutes speaking with her about these issues.      Assessment & Plan:  Weight gain  Hypothyroidism  Hyperlipidemia  Mild elevation of liver functions  Plan: Follow-up in 8 weeks with repeat lipid panel and liver functions.  She will take Crestor 5 mg 3 times a week.

## 2017-03-13 NOTE — Patient Instructions (Signed)
Decrease Crestor to 3 times a week.  Follow-up in 8 weeks.  Try to get in more exercise and watch caloric intake.  Continue brand-name Synthroid.

## 2017-03-28 ENCOUNTER — Telehealth: Payer: Self-pay | Admitting: Internal Medicine

## 2017-03-28 DIAGNOSIS — Z7721 Contact with and (suspected) exposure to potentially hazardous body fluids: Secondary | ICD-10-CM

## 2017-03-28 MED ORDER — OSELTAMIVIR PHOSPHATE 75 MG PO CAPS: 75.0000 mg | ORAL_CAPSULE | Freq: Two times a day (BID) | ORAL | 0 refills | Status: AC

## 2017-03-28 MED FILL — OSELTAMIVIR PHOSPHATE 75 MG: 75 | 5 days supply | Qty: 10 | Fill #0

## 2017-03-28 NOTE — Telephone Encounter (Signed)
Patient calling states that her husband was diagnosed yesterday with Flu Type A and his doctor recommended that she and their children be treated prophylactically with Tamiflu.  She wants to know if you will call this in for her?    Pharmacy:  [facility]  Phone:  [phone]  Thank you.

## 2017-03-28 NOTE — Telephone Encounter (Signed)
Call in Tamiflu 75 mg bid x 5 days

## 2017-04-29 ENCOUNTER — Other Ambulatory Visit: Payer: Self-pay | Admitting: Internal Medicine

## 2017-04-29 DIAGNOSIS — E785 Hyperlipidemia, unspecified: Secondary | ICD-10-CM

## 2017-04-29 DIAGNOSIS — Z79899 Other long term (current) drug therapy: Secondary | ICD-10-CM

## 2017-04-30 MED FILL — OGESTREL TABLET: 0.5-50 | 84 days supply | Qty: 84 | Fill #2

## 2017-05-05 ENCOUNTER — Other Ambulatory Visit: Payer: 59 | Admitting: Internal Medicine

## 2017-05-05 DIAGNOSIS — E785 Hyperlipidemia, unspecified: Secondary | ICD-10-CM | POA: Diagnosis not present

## 2017-05-05 DIAGNOSIS — Z79899 Other long term (current) drug therapy: Secondary | ICD-10-CM

## 2017-05-05 LAB — LIPID PANEL
CHOLESTEROL: 162 mg/dL (ref <200)
HDL: 43 mg/dL — AB (ref >50)
LDL Cholesterol (Calc): 92 mg/dL (calc)
Non-HDL Cholesterol (Calc): 119 mg/dL (calc) (ref <130)
TRIGLYCERIDES: 172 mg/dL — AB (ref <150)
Total CHOL/HDL Ratio: 3.8 (calc) (ref <5.0)

## 2017-05-05 LAB — HEPATIC FUNCTION PANEL
AG RATIO: 1.7 (calc) (ref 1.0–2.5)
ALKALINE PHOSPHATASE (APISO): 52 U/L (ref 33–115)
ALT: 65 U/L — AB (ref 6–29)
AST: 41 U/L — AB (ref 10–35)
Albumin: 4.3 g/dL (ref 3.6–5.1)
Bilirubin, Direct: 0.1 mg/dL (ref 0.0–0.2)
Globulin: 2.6 g/dL (calc) (ref 1.9–3.7)
Indirect Bilirubin: 0.4 mg/dL (calc) (ref 0.2–1.2)
TOTAL PROTEIN: 6.9 g/dL (ref 6.1–8.1)
Total Bilirubin: 0.5 mg/dL (ref 0.2–1.2)

## 2017-05-08 ENCOUNTER — Encounter: Payer: Self-pay | Admitting: Internal Medicine

## 2017-05-08 ENCOUNTER — Ambulatory Visit (INDEPENDENT_AMBULATORY_CARE_PROVIDER_SITE_OTHER): Payer: 59 | Admitting: Internal Medicine

## 2017-05-08 ENCOUNTER — Other Ambulatory Visit: Payer: Self-pay | Admitting: Internal Medicine

## 2017-05-08 VITALS — BP 110/78 | HR 75 | Ht 63.25 in

## 2017-05-08 DIAGNOSIS — E782 Mixed hyperlipidemia: Secondary | ICD-10-CM

## 2017-05-08 DIAGNOSIS — R748 Abnormal levels of other serum enzymes: Secondary | ICD-10-CM | POA: Diagnosis not present

## 2017-05-08 DIAGNOSIS — Z79899 Other long term (current) drug therapy: Secondary | ICD-10-CM

## 2017-05-08 DIAGNOSIS — E785 Hyperlipidemia, unspecified: Secondary | ICD-10-CM

## 2017-05-08 NOTE — Progress Notes (Signed)
   Subjective:    Patient ID: Natalie Kelly, female    DOB: 11-24-1970, 47 y.o.   MRN: [id number]  HPI  Had normal lipids in January after we started Crestor but SGOT increased from 17-52 and SGPT increased from 14-61. We changed Crestor from daily to 3 times a week hoping liver functions would improve.  Her LDL stayed down at 92 but her triglycerides went up from 82-172 and total cholesterol was normal at 162.  However liver functions really did not change much at all because SGOT is now 41 and SGPT 65.  She feels well.  She remains overweight and has been trying to do some dieting recently.  He works long hours.   Review of Systems see above     Objective:   Physical Exam  I did not examine her today but spent 20 minutes speaking with her about these issues and how to proceed.  I am suspicious this could be fatty liver or perhaps Crestor.  She is going to stop Crestor and we will follow-up with lipid panel in a couple of weeks.  In the meantime she is going to have an ultrasound of her liver and gallbladder.        Assessment & Plan:  Elevated liver function tests-stop Crestor and have liver ultrasound.  Follow-up in 2 weeks with   lipid panel only.

## 2017-05-08 NOTE — Patient Instructions (Signed)
Stop Crestor entirely.  Follow-up with  liver panel in 2 weeks.  Have ultrasound of liver and gallbladder.  We will handle results by phone.

## 2017-05-22 ENCOUNTER — Other Ambulatory Visit: Payer: 59

## 2017-05-22 ENCOUNTER — Ambulatory Visit
Admission: RE | Admit: 2017-05-22 | Discharge: 2017-05-22 | Disposition: A | Discharge status: Home / Self Care | Payer: 59 | Source: Ambulatory Visit | Attending: Internal Medicine | Admitting: Internal Medicine

## 2017-05-22 DIAGNOSIS — R748 Abnormal levels of other serum enzymes: Secondary | ICD-10-CM | POA: Diagnosis not present

## 2017-05-26 ENCOUNTER — Other Ambulatory Visit: Payer: 59 | Admitting: Internal Medicine

## 2017-05-26 DIAGNOSIS — Z79899 Other long term (current) drug therapy: Secondary | ICD-10-CM | POA: Diagnosis not present

## 2017-05-26 DIAGNOSIS — E785 Hyperlipidemia, unspecified: Secondary | ICD-10-CM | POA: Diagnosis not present

## 2017-05-26 LAB — HEPATIC FUNCTION PANEL
AG Ratio: 1.4 (calc) (ref 1.0–2.5)
ALT: 21 U/L (ref 6–29)
AST: 24 U/L (ref 10–35)
Albumin: 4.2 g/dL (ref 3.6–5.1)
Alkaline phosphatase (APISO): 43 U/L (ref 33–115)
BILIRUBIN DIRECT: 0.1 mg/dL (ref 0.0–0.2)
BILIRUBIN INDIRECT: 0.3 mg/dL (ref 0.2–1.2)
GLOBULIN: 3 g/dL (ref 1.9–3.7)
Total Bilirubin: 0.4 mg/dL (ref 0.2–1.2)
Total Protein: 7.2 g/dL (ref 6.1–8.1)

## 2017-06-11 ENCOUNTER — Other Ambulatory Visit: Payer: Self-pay | Admitting: Internal Medicine

## 2017-06-11 MED FILL — SYNTHROID 112 MCG TABLET: 112 | 90 days supply | Qty: 90 | Fill #0

## 2017-06-11 MED FILL — TOPIRAMATE 50 MG TABLET: 50 | 90 days supply | Qty: 90 | Fill #2

## 2017-07-23 MED FILL — OGESTREL TABLET: 0.5-50 | 84 days supply | Qty: 84 | Fill #3

## 2017-09-05 MED FILL — TOPIRAMATE 50 MG TABLET: 50 | 90 days supply | Qty: 90 | Fill #3

## 2017-09-05 MED FILL — SYNTHROID 112 MCG TABLET: 112 | 90 days supply | Qty: 90 | Fill #1

## 2017-10-08 DIAGNOSIS — Z6829 Body mass index (BMI) 29.0-29.9, adult: Secondary | ICD-10-CM | POA: Diagnosis not present

## 2017-10-08 DIAGNOSIS — Z01419 Encounter for gynecological examination (general) (routine) without abnormal findings: Secondary | ICD-10-CM | POA: Diagnosis not present

## 2017-10-08 DIAGNOSIS — Z1231 Encounter for screening mammogram for malignant neoplasm of breast: Secondary | ICD-10-CM | POA: Diagnosis not present

## 2017-10-16 MED FILL — OGESTREL TABLET: 0.5-50 | 84 days supply | Qty: 84 | Fill #0

## 2017-12-09 ENCOUNTER — Other Ambulatory Visit: Payer: Self-pay | Admitting: Internal Medicine

## 2017-12-09 MED FILL — SYNTHROID 112 MCG TABLET: 112 | 90 days supply | Qty: 90 | Fill #0

## 2017-12-09 MED FILL — TOPIRAMATE 50 MG TABLET: 50 | 90 days supply | Qty: 90 | Fill #0

## 2018-01-05 MED FILL — OGESTREL TABLET: 0.5-50 | 84 days supply | Qty: 84 | Fill #1

## 2018-02-27 ENCOUNTER — Other Ambulatory Visit: Payer: 59 | Admitting: Internal Medicine

## 2018-02-27 DIAGNOSIS — Z0000 Encounter for general adult medical examination without abnormal findings: Secondary | ICD-10-CM

## 2018-02-27 DIAGNOSIS — Z6830 Body mass index (BMI) 30.0-30.9, adult: Secondary | ICD-10-CM | POA: Diagnosis not present

## 2018-02-27 DIAGNOSIS — R7302 Impaired glucose tolerance (oral): Secondary | ICD-10-CM | POA: Diagnosis not present

## 2018-02-27 DIAGNOSIS — E782 Mixed hyperlipidemia: Secondary | ICD-10-CM | POA: Diagnosis not present

## 2018-02-27 DIAGNOSIS — E039 Hypothyroidism, unspecified: Secondary | ICD-10-CM

## 2018-02-27 DIAGNOSIS — R748 Abnormal levels of other serum enzymes: Secondary | ICD-10-CM | POA: Diagnosis not present

## 2018-02-28 LAB — COMPLETE METABOLIC PANEL WITH GFR
AG Ratio: 1.4 (calc) (ref 1.0–2.5)
ALBUMIN MSPROF: 4.1 g/dL (ref 3.6–5.1)
ALT: 13 U/L (ref 6–29)
AST: 16 U/L (ref 10–35)
Alkaline phosphatase (APISO): 49 U/L (ref 33–115)
BUN: 12 mg/dL (ref 7–25)
CALCIUM: 9.7 mg/dL (ref 8.6–10.2)
CO2: 21 mmol/L (ref 20–32)
CREATININE: 0.86 mg/dL (ref 0.50–1.10)
Chloride: 107 mmol/L (ref 98–110)
GFR, EST AFRICAN AMERICAN: 92 mL/min/{1.73_m2} (ref >=60)
GFR, EST NON AFRICAN AMERICAN: 79 mL/min/{1.73_m2} (ref >=60)
GLUCOSE: 87 mg/dL (ref 65–99)
Globulin: 3 g/dL (calc) (ref 1.9–3.7)
Potassium: 4.6 mmol/L (ref 3.5–5.3)
Sodium: 139 mmol/L (ref 135–146)
TOTAL PROTEIN: 7.1 g/dL (ref 6.1–8.1)
Total Bilirubin: 0.5 mg/dL (ref 0.2–1.2)

## 2018-02-28 LAB — CBC WITH DIFFERENTIAL/PLATELET
ABSOLUTE MONOCYTES: 662 {cells}/uL (ref 200–950)
BASOS PCT: 1 %
Basophils Absolute: 86 cells/uL (ref 0–200)
Eosinophils Absolute: 112 cells/uL (ref 15–500)
Eosinophils Relative: 1.3 %
HCT: 40.8 % (ref 35.0–45.0)
HEMOGLOBIN: 13.7 g/dL (ref 11.7–15.5)
Lymphs Abs: 2279 cells/uL (ref 850–3900)
MCH: 27.8 pg (ref 27.0–33.0)
MCHC: 33.6 g/dL (ref 32.0–36.0)
MCV: 82.9 fL (ref 80.0–100.0)
MONOS PCT: 7.7 %
MPV: 10.2 fL (ref 7.5–12.5)
NEUTROS ABS: 5461 {cells}/uL (ref 1500–7800)
Neutrophils Relative %: 63.5 %
Platelets: 445 10*3/uL — ABNORMAL HIGH (ref 140–400)
RBC: 4.92 10*6/uL (ref 3.80–5.10)
RDW: 12.9 % (ref 11.0–15.0)
Total Lymphocyte: 26.5 %
WBC: 8.6 10*3/uL (ref 3.8–10.8)

## 2018-02-28 LAB — LIPID PANEL
CHOL/HDL RATIO: 5.2 (calc) — AB (ref <5.0)
Cholesterol: 237 mg/dL — ABNORMAL HIGH (ref <200)
HDL: 46 mg/dL — ABNORMAL LOW (ref >50)
LDL CHOLESTEROL (CALC): 156 mg/dL — AB
NON-HDL CHOLESTEROL (CALC): 191 mg/dL — AB (ref <130)
TRIGLYCERIDES: 195 mg/dL — AB (ref <150)

## 2018-02-28 LAB — VITAMIN D 25 HYDROXY (VIT D DEFICIENCY, FRACTURES): Vit D, 25-Hydroxy: 37 ng/mL (ref 30–100)

## 2018-02-28 LAB — HEMOGLOBIN A1C
Hgb A1c MFr Bld: 5.8 % of total Hgb — ABNORMAL HIGH (ref <5.7)
Mean Plasma Glucose: 120 (calc)
eAG (mmol/L): 6.6 (calc)

## 2018-02-28 LAB — TSH: TSH: 1.9 m[IU]/L

## 2018-03-03 ENCOUNTER — Encounter: Payer: Self-pay | Admitting: Internal Medicine

## 2018-03-03 ENCOUNTER — Ambulatory Visit (INDEPENDENT_AMBULATORY_CARE_PROVIDER_SITE_OTHER): Payer: 59 | Admitting: Internal Medicine

## 2018-03-03 VITALS — BP 130/80 | HR 80 | Temp 98.6°F | Ht 63.25 in | Wt 172.0 lb

## 2018-03-03 DIAGNOSIS — Z6830 Body mass index (BMI) 30.0-30.9, adult: Secondary | ICD-10-CM

## 2018-03-03 DIAGNOSIS — E782 Mixed hyperlipidemia: Secondary | ICD-10-CM | POA: Diagnosis not present

## 2018-03-03 DIAGNOSIS — Z87898 Personal history of other specified conditions: Secondary | ICD-10-CM | POA: Diagnosis not present

## 2018-03-03 DIAGNOSIS — Z8669 Personal history of other diseases of the nervous system and sense organs: Secondary | ICD-10-CM | POA: Diagnosis not present

## 2018-03-03 DIAGNOSIS — Z0000 Encounter for general adult medical examination without abnormal findings: Secondary | ICD-10-CM | POA: Diagnosis not present

## 2018-03-03 DIAGNOSIS — E039 Hypothyroidism, unspecified: Secondary | ICD-10-CM

## 2018-03-03 DIAGNOSIS — L299 Pruritus, unspecified: Secondary | ICD-10-CM

## 2018-03-03 MED ORDER — EZETIMIBE 10 MG PO TABS: 10.0000 mg | ORAL_TABLET | Freq: Every day | ORAL | 3 refills | Status: DC

## 2018-03-03 MED ORDER — NEOMYCIN-POLYMYXIN-HC 1 % OT SOLN: 3.0000 [drp] | Freq: Four times a day (QID) | OTIC | 0 refills | Status: DC

## 2018-03-03 MED FILL — EZETIMIBE 10 MG TABS: 10 | 90 days supply | Qty: 90 | Fill #0

## 2018-03-03 MED FILL — NEO/POLYMYXIN/HC EAR SOLN: 3.5-10000-1 | 8 days supply | Qty: 10 | Fill #0

## 2018-03-03 NOTE — Progress Notes (Signed)
Subjective:    Patient ID: Natalie Kelly, female    DOB: Jan 07, 1971, 48 y.o.   MRN: [id number]  HPI 48 year old Female for health maintenance exam and evaluation of medical issues.  History of hypothyroidism, hyperlipidemia, obesity and migraine headaches.  Is seen dietitian in the past.  Main issue has been consistency with diet and exercise.  For migraine headaches, uses Imitrex and Fioricet with good relief.  Is also on Topamax as preventative.  Is seen Dr. Loanne Drilling in the past regarding hypothyroidism and he concurred with treating her with thyroid replacement medication.  Hyperlipidemia was diagnosed in 2010.  Has not wanted to be on statin medication.  History of seborrhea in the ears treated with as needed Cortisporin otic suspension.  Dr. Radene Knee is GYN physician.  Takes oral contraceptives.  History of right patellar fracture 2008.  Received flu vaccine through employment.  Social history: She is married.  Does not smoke or consume alcohol.  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  Family history: Father with history of stroke, hypertension and headaches.  Mother with history of headaches.  One brother with history of headaches.      Review of Systems  Constitutional: Positive for fatigue.  Respiratory: Negative.   Cardiovascular: Negative.   Gastrointestinal: Negative.   Genitourinary: Negative.   Neurological:       History of migraine headaches       Objective:   Physical Exam Vitals signs reviewed.  Constitutional:      General: She is not in acute distress.    Appearance: Normal appearance.  HENT:     Head: Normocephalic and atraumatic.     Right Ear: Tympanic membrane normal.     Left Ear: Tympanic membrane normal.     Nose: Nose normal.     Mouth/Throat:     Mouth: Mucous membranes are moist.  Eyes:     General: No scleral icterus.       Right eye: No discharge.        Left eye: No discharge.       Extraocular Movements: Extraocular movements intact.     Pupils: Pupils are equal, round, and reactive to light.  Neck:     Musculoskeletal: Neck supple.     Vascular: No carotid bruit.     Comments: No thyromegaly Cardiovascular:     Rate and Rhythm: Normal rate and regular rhythm.     Heart sounds: No murmur.  Pulmonary:     Effort: Pulmonary effort is normal. No respiratory distress.     Breath sounds: Normal breath sounds. No wheezing or rales.     Comments: Breasts without masses Abdominal:     Palpations: Abdomen is soft. There is no mass.     Tenderness: There is no abdominal tenderness. There is no rebound.  Genitourinary:    Comments: Deferred to GYN Lymphadenopathy:     Cervical: No cervical adenopathy.  Skin:    General: Skin is warm and dry.  Neurological:     General: No focal deficit present.     Mental Status: She is alert and oriented to person, place, and time.     Cranial Nerves: No cranial nerve deficit.     Motor: No weakness.     Coordination: Coordination normal.  Psychiatric:        Mood and Affect: Mood normal.        Behavior: Behavior normal.  Judgment: Judgment normal.           Assessment & Plan:  History of migraine headaches treated with Imitrex and Fioricet along with Topamax with good success  Hyperlipidemia-total cholesterol 237 and in March 2019 was 162.  Triglycerides 195 in March 2019 172.  LDL cholesterol is 156 and in March 2019 was 92.  Did not like statin therapy.  Try Zetia 10 mg daily and follow-up in March  History of impaired glucose tolerance with hemoglobin A1c 5.8%  Hypothyroidism-TSH 1.90 continue same dose of thyroid replacement medication  Oral contraceptive therapy per GYN  BMI 30.23

## 2018-03-10 MED FILL — SYNTHROID 112 MCG TABLET: 112 | 90 days supply | Qty: 90 | Fill #1

## 2018-03-10 MED FILL — TOPIRAMATE 50 MG TABLET: 50 | 90 days supply | Qty: 90 | Fill #1

## 2018-03-28 ENCOUNTER — Encounter: Payer: Self-pay | Admitting: Internal Medicine

## 2018-03-28 NOTE — Patient Instructions (Signed)
Try Zetia instead of statin medication and follow-up in March.  Otherwise continue other medications.  Please try to diet exercise and lose weight.

## 2018-04-02 MED FILL — OGESTREL TABLET: 0.5-50 | 84 days supply | Qty: 84 | Fill #2 | Status: TO

## 2018-05-07 ENCOUNTER — Other Ambulatory Visit: Payer: 59 | Admitting: Internal Medicine

## 2018-05-07 ENCOUNTER — Other Ambulatory Visit: Payer: Self-pay

## 2018-05-07 DIAGNOSIS — E785 Hyperlipidemia, unspecified: Secondary | ICD-10-CM

## 2018-05-07 LAB — LIPID PANEL
CHOL/HDL RATIO: 4.1 (calc) (ref <5.0)
Cholesterol: 181 mg/dL (ref <200)
HDL: 44 mg/dL — ABNORMAL LOW (ref >=50)
LDL Cholesterol (Calc): 103 mg/dL (calc) — ABNORMAL HIGH
Non-HDL Cholesterol (Calc): 137 mg/dL (calc) — ABNORMAL HIGH (ref <130)
Triglycerides: 222 mg/dL — ABNORMAL HIGH (ref <150)

## 2018-05-08 ENCOUNTER — Ambulatory Visit (INDEPENDENT_AMBULATORY_CARE_PROVIDER_SITE_OTHER): Payer: 59 | Admitting: Internal Medicine

## 2018-05-08 ENCOUNTER — Encounter: Payer: Self-pay | Admitting: Internal Medicine

## 2018-05-08 DIAGNOSIS — E039 Hypothyroidism, unspecified: Secondary | ICD-10-CM

## 2018-05-08 DIAGNOSIS — Z8669 Personal history of other diseases of the nervous system and sense organs: Secondary | ICD-10-CM

## 2018-05-08 DIAGNOSIS — Z87898 Personal history of other specified conditions: Secondary | ICD-10-CM

## 2018-05-08 DIAGNOSIS — E782 Mixed hyperlipidemia: Secondary | ICD-10-CM

## 2018-05-08 MED ORDER — ALPRAZOLAM 0.25 MG PO TABS: 0.2500 mg | ORAL_TABLET | Freq: Two times a day (BID) | ORAL | 0 refills | Status: DC | PRN

## 2018-05-08 MED ORDER — BUTALBITAL-APAP-CAFFEINE 50-325-40 MG PO TABS: ORAL_TABLET | ORAL | 2 refills | Status: DC

## 2018-05-08 NOTE — Progress Notes (Addendum)
   Subjective:    Patient ID: Natalie Kelly, female    DOB: 03-Sep-1970, 48 y.o.   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  She also has impaired glucose tolerance. In January hemoglobin A1c was 5.8%.  In January lipid panel showed total cholesterol of 237, triglycerides of 195 and LDL cholesterol of 156.  Nasal total cholesterol is 181, HDL cholesterol 44, triglycerides 222 and LDL cholesterol 103.  Currently on Zetia 10 mg daily.  Previously tried on Crestor and did not like statin therapy.  History of migraine headaches treated with Imitrex, as needed Fioricet and Topamax with good success  Due to the coronavirus outbreak she was seen by [employer].   Review of Systems see above no new complaints     Objective:   Physical Exam  Not examined but spent 15 minutes speaking with her about WebEx about hyperlipidemia results and Zetia.  She has no side effects with Zetia.  Needs to be sure she has refills on migraine medications.      Assessment & Plan:  Mixed hyperlipidemia-Zetia will not treat hypertriglyceridemia.  We could add fenofibrate but we will continue to monitor at this point time.  History of migraine headaches-make sure she has proper refills for her medications especially during Coronavirus outbreak  Hypothyroidism-normal TSH in January continue same dose of thyroid replacement.  Plan: Would be comfortable rechecking lipid panel and hemoglobin A1c in late summer when she is getting more exercise without office visit.  Physical exam due January 2021.

## 2018-05-11 NOTE — Patient Instructions (Signed)
Suggest 52-month follow-up visit late summer July or August for follow-up on hyperlipidemia, impaired glucose tolerance and hypothyroidism.  Physical exam due January 2021.

## 2018-05-16 MED FILL — BUTALBITAL-APAP-CAFFEINE 50: 50-325-40 | 5 days supply | Qty: 30 | Fill #0

## 2018-05-16 MED FILL — ALPRAZolam 0.25 MG TABS: 0.25 | 10 days supply | Qty: 20 | Fill #0

## 2018-05-16 MED FILL — OGESTREL TABLET: 0.5-50 | 28 days supply | Qty: 28 | Fill #0

## 2018-05-16 MED FILL — EZETIMIBE 10 MG TABS: 10 | 90 days supply | Qty: 90 | Fill #0

## 2018-06-05 ENCOUNTER — Other Ambulatory Visit: Payer: Self-pay | Admitting: Internal Medicine

## 2018-06-05 MED FILL — SYNTHROID 112 MCG TABLET: 112 | 90 days supply | Qty: 90 | Fill #0

## 2018-06-05 MED FILL — TOPIRAMATE 50 MG TABLET: 50 | 90 days supply | Qty: 90 | Fill #0

## 2018-08-27 MED FILL — EZETIMIBE 10 MG TABS: 10 | 90 days supply | Qty: 90 | Fill #0

## 2018-09-08 MED FILL — TOPIRAMATE 50 MG TABLET: 50 | 90 days supply | Qty: 90 | Fill #0

## 2018-09-08 MED FILL — SYNTHROID 112 MCG TABLET: 112 | 90 days supply | Qty: 90 | Fill #0

## 2018-10-12 DIAGNOSIS — N951 Menopausal and female climacteric states: Secondary | ICD-10-CM | POA: Diagnosis not present

## 2018-10-12 DIAGNOSIS — Z1231 Encounter for screening mammogram for malignant neoplasm of breast: Secondary | ICD-10-CM | POA: Diagnosis not present

## 2018-10-12 DIAGNOSIS — Z6829 Body mass index (BMI) 29.0-29.9, adult: Secondary | ICD-10-CM | POA: Diagnosis not present

## 2018-10-12 DIAGNOSIS — Z01419 Encounter for gynecological examination (general) (routine) without abnormal findings: Secondary | ICD-10-CM | POA: Diagnosis not present

## 2018-10-21 DIAGNOSIS — Z13820 Encounter for screening for osteoporosis: Secondary | ICD-10-CM | POA: Diagnosis not present

## 2018-10-21 MED FILL — ELINEST-28 TABLET: 0.3-30 | 84 days supply | Qty: 84 | Fill #0

## 2018-12-02 ENCOUNTER — Other Ambulatory Visit: Payer: Self-pay | Admitting: Internal Medicine

## 2018-12-02 MED FILL — SYNTHROID 112 MCG TABLET: 112 | 90 days supply | Qty: 90 | Fill #0

## 2018-12-02 MED FILL — TOPIRAMATE 50 MG TABLET: 50 | 90 days supply | Qty: 90 | Fill #0

## 2018-12-02 MED FILL — EZETIMIBE 10 MG TABS: 10 | 90 days supply | Qty: 90 | Fill #1

## 2019-01-14 MED FILL — ELINEST-28 TABLET: 0.3-30 | 84 days supply | Qty: 84 | Fill #1

## 2019-03-02 ENCOUNTER — Other Ambulatory Visit: Payer: Self-pay | Admitting: Internal Medicine

## 2019-03-02 MED FILL — EZETIMIBE 10 MG TABS: 10 | 90 days supply | Qty: 90 | Fill #0

## 2019-03-09 ENCOUNTER — Other Ambulatory Visit: Payer: Self-pay | Admitting: Internal Medicine

## 2019-03-09 MED FILL — TOPIRAMATE 50 MG TABLET: 50 | 90 days supply | Qty: 90 | Fill #0

## 2019-03-09 MED FILL — SYNTHROID 112 MCG TABLET: 112 | 90 days supply | Qty: 90 | Fill #0

## 2019-03-09 NOTE — Telephone Encounter (Signed)
Scheduled for April

## 2019-03-09 NOTE — Telephone Encounter (Signed)
Cannot refill until makes CPE appt- last was late January 2020

## 2019-04-09 MED FILL — ELINEST-28 TABLET: 0.3-30 | 84 days supply | Qty: 84 | Fill #2

## 2019-05-11 ENCOUNTER — Other Ambulatory Visit: Payer: Self-pay

## 2019-05-11 ENCOUNTER — Other Ambulatory Visit: Payer: 59 | Admitting: Internal Medicine

## 2019-05-11 DIAGNOSIS — E782 Mixed hyperlipidemia: Secondary | ICD-10-CM | POA: Diagnosis not present

## 2019-05-11 DIAGNOSIS — Z1321 Encounter for screening for nutritional disorder: Secondary | ICD-10-CM | POA: Diagnosis not present

## 2019-05-11 DIAGNOSIS — R7302 Impaired glucose tolerance (oral): Secondary | ICD-10-CM

## 2019-05-11 DIAGNOSIS — E039 Hypothyroidism, unspecified: Secondary | ICD-10-CM | POA: Diagnosis not present

## 2019-05-11 DIAGNOSIS — Z0000 Encounter for general adult medical examination without abnormal findings: Secondary | ICD-10-CM

## 2019-05-12 LAB — TSH: TSH: 1.25 mIU/L

## 2019-05-12 LAB — HEMOGLOBIN A1C
Hgb A1c MFr Bld: 5.7 % of total Hgb — ABNORMAL HIGH (ref <5.7)
Mean Plasma Glucose: 117 (calc)
eAG (mmol/L): 6.5 (calc)

## 2019-05-12 LAB — CBC WITH DIFFERENTIAL/PLATELET
Absolute Monocytes: 779 cells/uL (ref 200–950)
Basophils Absolute: 82 cells/uL (ref 0–200)
Basophils Relative: 1 %
Eosinophils Absolute: 279 cells/uL (ref 15–500)
Eosinophils Relative: 3.4 %
HCT: 42.6 % (ref 35.0–45.0)
Hemoglobin: 14.1 g/dL (ref 11.7–15.5)
Lymphs Abs: 2288 cells/uL (ref 850–3900)
MCH: 28.4 pg (ref 27.0–33.0)
MCHC: 33.1 g/dL (ref 32.0–36.0)
MCV: 85.9 fL (ref 80.0–100.0)
MPV: 10.1 fL (ref 7.5–12.5)
Monocytes Relative: 9.5 %
Neutro Abs: 4772 cells/uL (ref 1500–7800)
Neutrophils Relative %: 58.2 %
Platelets: 363 10*3/uL (ref 140–400)
RBC: 4.96 10*6/uL (ref 3.80–5.10)
RDW: 12.6 % (ref 11.0–15.0)
Total Lymphocyte: 27.9 %
WBC: 8.2 10*3/uL (ref 3.8–10.8)

## 2019-05-12 LAB — LIPID PANEL
Cholesterol: 208 mg/dL — ABNORMAL HIGH (ref <200)
HDL: 48 mg/dL — ABNORMAL LOW (ref >=50)
LDL Cholesterol (Calc): 126 mg/dL (calc) — ABNORMAL HIGH
Non-HDL Cholesterol (Calc): 160 mg/dL (calc) — ABNORMAL HIGH (ref <130)
Total CHOL/HDL Ratio: 4.3 (calc) (ref <5.0)
Triglycerides: 202 mg/dL — ABNORMAL HIGH (ref <150)

## 2019-05-12 LAB — COMPLETE METABOLIC PANEL WITH GFR
AG Ratio: 1.4 (calc) (ref 1.0–2.5)
ALT: 17 U/L (ref 6–29)
AST: 17 U/L (ref 10–35)
Albumin: 4.1 g/dL (ref 3.6–5.1)
Alkaline phosphatase (APISO): 63 U/L (ref 37–153)
BUN: 17 mg/dL (ref 7–25)
CO2: 23 mmol/L (ref 20–32)
Calcium: 9.4 mg/dL (ref 8.6–10.4)
Chloride: 107 mmol/L (ref 98–110)
Creat: 0.82 mg/dL (ref 0.50–1.05)
GFR, Est African American: 97 mL/min/{1.73_m2} (ref >=60)
GFR, Est Non African American: 83 mL/min/{1.73_m2} (ref >=60)
Globulin: 3 g/dL (calc) (ref 1.9–3.7)
Glucose, Bld: 92 mg/dL (ref 65–99)
Potassium: 5 mmol/L (ref 3.5–5.3)
Sodium: 139 mmol/L (ref 135–146)
Total Bilirubin: 0.4 mg/dL (ref 0.2–1.2)
Total Protein: 7.1 g/dL (ref 6.1–8.1)

## 2019-05-12 LAB — VITAMIN D 25 HYDROXY (VIT D DEFICIENCY, FRACTURES): Vit D, 25-Hydroxy: 23 ng/mL — ABNORMAL LOW (ref 30–100)

## 2019-05-20 ENCOUNTER — Encounter: Payer: Self-pay | Admitting: Internal Medicine

## 2019-05-20 ENCOUNTER — Other Ambulatory Visit: Payer: Self-pay

## 2019-05-20 ENCOUNTER — Ambulatory Visit (INDEPENDENT_AMBULATORY_CARE_PROVIDER_SITE_OTHER): Payer: 59 | Admitting: Internal Medicine

## 2019-05-20 VITALS — BP 130/90 | HR 77 | Temp 98.9°F | Ht 63.0 in | Wt 176.0 lb

## 2019-05-20 DIAGNOSIS — Z87898 Personal history of other specified conditions: Secondary | ICD-10-CM | POA: Diagnosis not present

## 2019-05-20 DIAGNOSIS — F419 Anxiety disorder, unspecified: Secondary | ICD-10-CM

## 2019-05-20 DIAGNOSIS — E782 Mixed hyperlipidemia: Secondary | ICD-10-CM

## 2019-05-20 DIAGNOSIS — Z8669 Personal history of other diseases of the nervous system and sense organs: Secondary | ICD-10-CM | POA: Diagnosis not present

## 2019-05-20 DIAGNOSIS — Z0000 Encounter for general adult medical examination without abnormal findings: Secondary | ICD-10-CM | POA: Diagnosis not present

## 2019-05-20 DIAGNOSIS — E039 Hypothyroidism, unspecified: Secondary | ICD-10-CM | POA: Diagnosis not present

## 2019-05-20 DIAGNOSIS — Z6831 Body mass index (BMI) 31.0-31.9, adult: Secondary | ICD-10-CM | POA: Diagnosis not present

## 2019-05-20 LAB — POCT URINALYSIS DIPSTICK
Appearance: NEGATIVE
Bilirubin, UA: NEGATIVE
Blood, UA: NEGATIVE
Glucose, UA: NEGATIVE
Ketones, UA: NEGATIVE
Leukocytes, UA: NEGATIVE
Nitrite, UA: NEGATIVE
Odor: NEGATIVE
Protein, UA: NEGATIVE
Spec Grav, UA: 1.015 (ref 1.010–1.025)
Urobilinogen, UA: 0.2 E.U./dL
pH, UA: 6.5 (ref 5.0–8.0)

## 2019-05-20 MED ORDER — SUMATRIPTAN 20 MG/ACT NA SOLN: NASAL | 3 refills | Status: DC

## 2019-05-20 MED ORDER — TOPIRAMATE 50 MG PO TABS: 50.0000 mg | ORAL_TABLET | Freq: Every day | ORAL | 1 refills | Status: DC

## 2019-05-20 MED ORDER — BUTALBITAL-APAP-CAFFEINE 50-325-40 MG PO TABS: ORAL_TABLET | ORAL | 2 refills | Status: DC

## 2019-05-20 MED ORDER — SYNTHROID 112 MCG PO TABS: 112.0000 ug | ORAL_TABLET | Freq: Every day | ORAL | 0 refills | Status: DC

## 2019-05-20 MED ORDER — EZETIMIBE 10 MG PO TABS: 10.0000 mg | ORAL_TABLET | Freq: Every day | ORAL | 1 refills | Status: DC

## 2019-05-20 MED ORDER — EPINEPHRINE 0.3 MG/0.3ML IJ SOAJ: 0.3000 mg | Freq: Once | INTRAMUSCULAR | 3 refills | Status: AC

## 2019-05-20 MED ORDER — ALPRAZOLAM 0.25 MG PO TABS: 0.2500 mg | ORAL_TABLET | Freq: Two times a day (BID) | ORAL | 5 refills | Status: DC | PRN

## 2019-05-20 MED FILL — EPINEPHRINE 0.3 MG AUTO-INJ: 0.3 | 30 days supply | Qty: 2 | Fill #0

## 2019-05-20 MED FILL — EZETIMIBE 10 MG TABS: 10 | 90 days supply | Qty: 90 | Fill #0

## 2019-05-20 MED FILL — ALPRAZolam 0.25 MG TABS: 0.25 | 10 days supply | Qty: 20 | Fill #0

## 2019-05-20 NOTE — Patient Instructions (Addendum)
Refer to lipid clinic.  Continue Zetia until seen by Dr. Debara Pickett at lipid clinic in July.  Take Xanax sparingly for mild anxiety.  Continue same dose of thyroid replacement.  Continue Topamax for prevention of migraine headaches and when needed Fioricet and Imitrex nasal spray.  Watch diet and try to get more exercise due to impaired glucose tolerance and hyperlipidemia.  Follow-up here in October.  It was a pleasure to see you today.

## 2019-05-20 NOTE — Progress Notes (Signed)
   Subjective:    Patient ID: Natalie Kelly, female    DOB: December 06, 1970, 49 y.o.   MRN: [id number]  HPI 49 year old Female for health maintenance exam and evaluation of medical issues.  History of hypothyroidism, hyperlipidemia, obesity and migraine headaches.  Has seen dietitian in the past.  Main issue has been with consistency following a diet and exercise.  For migraine headaches uses Imitrex and Fioricet with good relief.  Also on Topamax as preventative.  Has seen Dr. Jeannine Kitten in the past regarding hypothyroidism and they concurred with treating her with thyroid replacement medication.  Hyperlipidemia diagnosed in 2010.  Patient has not wanted to be on statin medication.  History of seborrhea in the ears treated with as needed Cortisporin otic suspension.  Dr. Binnie Kand GYN physician.  History of right patellar fracture 2008.  Received flu vaccine through employment and has had COVID-19 immunizations.  Social history: She is married.  Does not smoke or consume alcohol.  She has been oriented at home health for well over 20 years.  Now works as a Tourist information centre manager.  Family history: Father with history of stroke hypertension and headaches.  Mother with history of headaches.  1 brother with history of headaches.    Review of Systems history of migraine headaches.  Some anxiety issues with COVID-19 pandemic and workload     Objective:   Physical Exam Blood pressure 130/90 pulse 77 temperature 98.9 degrees orally pulse oximetry 99% weight 176 pounds height 5 feet 3 inches BMI 31.18  Skin warm and dry.  Nodes none.  TMs are clear.  Neck is supple.  No thyromegaly.  Chest clear to auscultation.  Cardiac exam regular rate and rhythm normal S1 and S2.  Abdomen soft nondistended without hepatosplenomegaly masses or tenderness.  Pelvic exam deferred to GYN physician.  Neuro intact without focal deficits.  Mood affect and judgment as well as behavior appear to be normal.       Assessment &  Plan:  Mild anxiety related to COVID-19 pandemic and work stress.  Prescribed Xanax 0.25 mg to take up to twice daily if needed for anxiety issues  History of migraine headaches treated with Topamax, as needed Fioricet and Imitrex nasal spray  Hyperlipidemia trial of Zetia 10 mg daily although I would prefer to have her on statin medication she does not want to take a statin.  See Dr. Debara Pickett in July.  Allergic rhinitis-treated with Zyrtec as needed  Hypothyroidism-stable with Synthroid 0.112 mg daily  BMI 31.18-continue diet and exercise efforts.  History of mild impaired glucose tolerance with hemoglobin A1c 5.7%-continue diet and exercise efforts.  Follow-up here in October.

## 2019-06-09 MED ORDER — BUTALBITAL-APAP-CAFFEINE 50-325-40 MG PO TABS: ORAL_TABLET | ORAL | 2 refills | Status: DC

## 2019-06-09 MED FILL — BUTALB-ACETAMIN-CAFF 50-325: 50-325-40 | 5 days supply | Qty: 30 | Fill #0

## 2019-06-11 MED FILL — SYNTHROID 112 MCG TABLET: 112 | 90 days supply | Qty: 90 | Fill #0

## 2019-06-11 MED FILL — TOPIRAMATE 50 MG TABLET: 50 | 90 days supply | Qty: 90 | Fill #0

## 2019-07-01 MED FILL — ELINEST 0.3-30 MG-MCG TABS: 0.3-30 | 84 days supply | Qty: 84 | Fill #3

## 2019-08-19 ENCOUNTER — Other Ambulatory Visit: Payer: Self-pay | Admitting: Internal Medicine

## 2019-08-19 ENCOUNTER — Ambulatory Visit (INDEPENDENT_AMBULATORY_CARE_PROVIDER_SITE_OTHER): Payer: 59 | Admitting: Internal Medicine

## 2019-08-19 ENCOUNTER — Encounter: Payer: Self-pay | Admitting: Internal Medicine

## 2019-08-19 ENCOUNTER — Other Ambulatory Visit: Payer: Self-pay

## 2019-08-19 VITALS — BP 124/86 | HR 75 | Ht 64.0 in | Wt 179.0 lb

## 2019-08-19 DIAGNOSIS — R748 Abnormal levels of other serum enzymes: Secondary | ICD-10-CM

## 2019-08-19 DIAGNOSIS — E785 Hyperlipidemia, unspecified: Secondary | ICD-10-CM | POA: Diagnosis not present

## 2019-08-19 MED ORDER — PRAVASTATIN SODIUM 20 MG PO TABS: 20.0000 mg | ORAL_TABLET | Freq: Every evening | ORAL | 3 refills | Status: DC

## 2019-08-19 MED FILL — PRAVASTATIN SODIUM 20 MG TA: 20 | 90 days supply | Qty: 90 | Fill #0

## 2019-08-19 NOTE — Progress Notes (Signed)
LIPID CLINIC CONSULT NOTE  Chief Complaint:  Manage dyslipidemia  Primary Care Physician: Elby Showers, MD  Primary Cardiologist:  Pixie Casino, MD  HPI:  Natalie Kelly is a 49 y.o. female who is being seen today for the evaluation of dyslipidemia at the request of Baxley, Cresenciano Lick, MD.  This is a pleasant 49 year old female who currently works as a Tourist information centre manager at [employer] with a history of dyslipidemia, thyroid disease and family history of early stroke in her father.  In the past she had been on statin therapy however developed elevated liver enzymes for which she discontinued it in 2019.  She was on rosuvastatin 5 mg daily which was then reduced to every other day.  Her ALT was increased to 61 and then 65, subsequently returning to normal a month later after discontinuing her statin.  Ultimately then she was placed on ezetimibe.  Her most recent lipid showed total cholesterol 208, triglycerides 202, HDL 48 and LDL 126.  She was referred for further evaluation recommendations for therapy with a preference for statin for greater cardiovascular risk reduction.  She reports overall fairly healthy diet, denies any alcohol tobacco use and tries to remain active but finds difficulty in exercising regularly.  PMHx:  Past Medical History:  Diagnosis Date   Anxiety    Depression    Hyperlipidemia    Migraine headache    Seborrhea    ear   Thyroid disease    hypothyroidism    Past Surgical History:  Procedure Laterality Date   CESAREAN SECTION  2001 AND 2004   ORIF PATELLA FRACTURE Left 2007    FAMHx:  Family History  Problem Relation Age of Onset   Stroke Father    Hypertension Father    Hyperlipidemia Father    Diabetes Father    Hyperlipidemia Mother    Asthma Daughter    Congestive Heart Failure Maternal Grandmother    Parkinson's disease Paternal Grandmother     SOCHx:   reports that she has never smoked. She has never used smokeless tobacco. She  reports that she does not drink alcohol and does not use drugs.  ALLERGIES:  No Known Allergies  ROS: Pertinent items noted in HPI and remainder of comprehensive ROS otherwise negative.  HOME MEDS: Current Outpatient Medications on File Prior to Visit  Medication Sig Dispense Refill   ALPRAZolam (XANAX) 0.25 MG tablet Take 1 tablet (0.25 mg total) by mouth 2 (two) times daily as needed for anxiety. 20 tablet 5   butalbital-acetaminophen-caffeine (FIORICET) 50-325-40 MG tablet One po q 4-6 hours prn migraine headaches not to exceed 6 tabs in 24 hours 30 tablet 2   cetirizine (ZYRTEC) 10 MG chewable tablet Chew 10 mg by mouth daily.     Cholecalciferol (VITAMIN D) 2000 UNITS CAPS Take by mouth. Reported on 07/20/2015     ELDERBERRY PO Take by mouth.     ELINEST 0.3-30 MG-MCG tablet Take 1 tablet by mouth daily.     ezetimibe (ZETIA) 10 MG tablet Take 1 tablet (10 mg total) by mouth daily. 90 tablet 1   NEOMYCIN-POLYMYXIN-HYDROCORTISONE (CORTISPORIN) 1 % SOLN OTIC solution Place 3 drops into both ears 4 (four) times daily. 10 mL 0   SUMAtriptan (IMITREX) 20 MG/ACT nasal spray Use as directed for migraine headache 1 Inhaler 3   SYNTHROID 112 MCG tablet Take 1 tablet (112 mcg total) by mouth daily. 90 tablet 0   topiramate (TOPAMAX) 50 MG tablet Take 1 tablet (  50 mg total) by mouth daily. 90 tablet 1   TURMERIC PO Take 1,500 mg by mouth daily. Reported on 07/20/2015     No current facility-administered medications on file prior to visit.    LABS/IMAGING: No results found for this or any previous visit (from the past 48 hour(s)). No results found.  LIPID PANEL:    Component Value Date/Time   CHOL 208 (H) 05/11/2019 0959   TRIG 202 (H) 05/11/2019 0959   HDL 48 (L) 05/11/2019 0959   CHOLHDL 4.3 05/11/2019 0959   VLDL 39 (H) 06/03/2016 1157   LDLCALC 126 (H) 05/11/2019 0959    WEIGHTS: Wt Readings from Last 3 Encounters:  08/19/19 179 lb (81.2 kg)  05/20/19 176 lb (79.8  kg)  03/03/18 172 lb (78 kg)    VITALS: BP 124/86    Pulse 75    Ht 5\' 4"  (1.626 m)    Wt 179 lb (81.2 kg)    SpO2 98%    BMI 30.73 kg/m   EXAM: Deferred  EKG: Deferred  ASSESSMENT: 1. Mixed dyslipidemia 2. Family history of premature stroke 3. History of elevated liver enzymes  PLAN: 1.   Natalie Kelly has a mixed dyslipidemia and had elevated liver enzymes previously on statin therapy.  This ultimately was much less than 3 times upper limit of normal, but did represent an increase that improved after discontinuing statin therapy.  She was on rosuvastatin.  Subsequently she has been on ezetimibe with some improvement in her numbers but is not yet reached target.  I agree that adding some statin therapy would be helpful with targeting LDL less than 100 and would recommend starting pravastatin 20 mg daily.  I think this will be well-tolerated and less likely to increase her liver enzymes.  That being said if it remains less than 3 times upper limit normal, I would still continue her therapy.  She did undergo a liver ultrasound which showed no evidence of fatty liver disease, a common cause of elevated liver enzymes during statin therapy.  Ultimately if we continue to struggle with elevated liver enzymes as a result of statin use, we could consider an alternative such as Livalo which is less likely to cause this issue but more difficult to be covered by insurance for she wondered whether we could consider bempedoic acid or combination bempedoic acid/ezetimibe since she is ready on this.  This could also increase liver enzymes and would need to be monitored.  We will plan repeat liver enzymes in about a month and repeat lipids in 3 months.  Thanks again for the kind referral.  Pixie Casino, MD, FACC, [place] Director of the [facility] Diplomate of the American Board of Clinical Lipidology Attending  Cardiologist  Direct Dial: [phone]   Fax: [phone]  Website:  [email or web address] 08/19/2019, 12:23 PM

## 2019-08-19 NOTE — Patient Instructions (Signed)
Medication Instructions:  START pravastatin 20mg  daily  *If you need a refill on your cardiac medications before your next appointment, please call your pharmacy*   Lab Work: LIVER FUNCTION PANEL in 4 weeks  FASTING LIPID PANEL in 12 weeks  If you have labs (blood work) drawn today and your tests are completely normal, you will receive your results only by: Marland Kitchen MyChart Message (if you have MyChart) OR . A paper copy in the mail If you have any lab test that is abnormal or we need to change your treatment, we will call you to review the results.   Testing/Procedures: NONE   Follow-Up: At [facility], you and your health needs are our priority.  As part of our continuing mission to provide you with exceptional heart care, we have created designated Provider Care Teams.  These Care Teams include your primary Cardiologist (physician) and Advanced Practice Providers (APPs -  Physician Assistants and Nurse Practitioners) who all work together to provide you with the care you need, when you need it.  We recommend signing up for the patient portal called "MyChart".  Sign up information is provided on this After Visit Summary.  MyChart is used to connect with patients for Virtual Visits (Telemedicine).  Patients are able to view lab/test results, encounter notes, upcoming appointments, etc.  Non-urgent messages can be sent to your provider as well.   To learn more about what you can do with MyChart, go to [email or web address].    Your next appointment:   3 month(s) - lipid clinic  The format for your next appointment:   Either In Person or Virtual  Provider:   K. Mali Hilty, MD   Other Instructions

## 2019-08-24 MED FILL — EZETIMIBE 10 MG TABS: 10 | 90 days supply | Qty: 90 | Fill #1

## 2019-09-01 ENCOUNTER — Encounter: Payer: Self-pay | Admitting: Gastroenterology

## 2019-09-10 ENCOUNTER — Other Ambulatory Visit: Payer: Self-pay | Admitting: Internal Medicine

## 2019-09-10 MED FILL — TOPIRAMATE 50 MG TABLET: 50 | 90 days supply | Qty: 90 | Fill #1

## 2019-09-10 MED FILL — SYNTHROID 112 MCG TABLET: 112 | 90 days supply | Qty: 90 | Fill #0

## 2019-09-21 DIAGNOSIS — E785 Hyperlipidemia, unspecified: Secondary | ICD-10-CM | POA: Diagnosis not present

## 2019-09-22 LAB — HEPATIC FUNCTION PANEL
ALT: 23 IU/L (ref 0–32)
AST: 24 IU/L (ref 0–40)
Albumin: 4.5 g/dL (ref 3.8–4.9)
Alkaline Phosphatase: 65 IU/L (ref 48–121)
Bilirubin Total: 0.2 mg/dL (ref 0.0–1.2)
Bilirubin, Direct: 0.08 mg/dL (ref 0.00–0.40)
Total Protein: 7.2 g/dL (ref 6.0–8.5)

## 2019-09-22 MED FILL — ELINEST 0.3-30 MG-MCG TABS: 0.3-30 | 84 days supply | Qty: 84 | Fill #4

## 2019-10-04 ENCOUNTER — Encounter: Payer: Self-pay | Admitting: Gastroenterology

## 2019-10-04 ENCOUNTER — Other Ambulatory Visit: Payer: Self-pay

## 2019-10-04 ENCOUNTER — Ambulatory Visit (AMBULATORY_SURGERY_CENTER): Payer: Self-pay

## 2019-10-04 VITALS — Ht 64.0 in | Wt 178.0 lb

## 2019-10-04 DIAGNOSIS — Z1211 Encounter for screening for malignant neoplasm of colon: Secondary | ICD-10-CM

## 2019-10-04 MED ORDER — NA SULFATE-K SULFATE-MG SULF 17.5-3.13-1.6 GM/177ML PO SOLN: 1.0000 | Freq: Once | ORAL | 0 refills | Status: AC

## 2019-10-04 MED FILL — SUPREP BOWEL PREP KIT: 17.5-3.13-1 | 1 days supply | Qty: 354 | Fill #0

## 2019-10-04 NOTE — Progress Notes (Signed)
No egg or soy allergy known to patient  No issues with past sedation with any surgeries or procedures No intubation problems in the past  No FH of Malignant Hyperthermia No diet pills per patient No home 02 use per patient  No blood thinners per patient  Pt denies issues with constipation  No A fib or A flutter  EMMI video via MyChart  COVID 19 guidelines implemented in PV today with Pt and RN  Coupon given to pt in PV today , Code to Pharmacy  COVID vaccines completed on 02/2019 per pt;  Due to the COVID-19 pandemic we are asking patients to follow these guidelines. Please only bring one care partner. Please be aware that your care partner may wait in the car in the parking lot or if they feel like they will be too hot to wait in the car, they may wait in the lobby on the 4th floor. All care partners are required to wear a mask the entire time (we do not have any that we can provide them), they need to practice social distancing, and we will do a Covid check for all patient's and care partners when you arrive. Also we will check their temperature and your temperature. If the care partner waits in their car they need to stay in the parking lot the entire time and we will call them on their cell phone when the patient is ready for discharge so they can bring the car to the front of the building. Also all patient's will need to wear a mask into building.  

## 2019-10-13 MED FILL — SUPREP BOWEL PREP KIT: 17.5-3.13-1 | 1 days supply | Qty: 354 | Fill #0

## 2019-10-28 DIAGNOSIS — Z1231 Encounter for screening mammogram for malignant neoplasm of breast: Secondary | ICD-10-CM | POA: Diagnosis not present

## 2019-10-28 DIAGNOSIS — Z01419 Encounter for gynecological examination (general) (routine) without abnormal findings: Secondary | ICD-10-CM | POA: Diagnosis not present

## 2019-10-28 DIAGNOSIS — Z6830 Body mass index (BMI) 30.0-30.9, adult: Secondary | ICD-10-CM | POA: Diagnosis not present

## 2019-11-02 ENCOUNTER — Encounter: Payer: Self-pay | Admitting: Gastroenterology

## 2019-11-02 ENCOUNTER — Other Ambulatory Visit: Payer: Self-pay

## 2019-11-02 ENCOUNTER — Ambulatory Visit (AMBULATORY_SURGERY_CENTER): Payer: 59 | Admitting: Gastroenterology

## 2019-11-02 VITALS — BP 138/86 | HR 61 | Temp 96.8°F | Resp 15 | Ht 64.0 in | Wt 178.0 lb

## 2019-11-02 DIAGNOSIS — D124 Benign neoplasm of descending colon: Secondary | ICD-10-CM | POA: Diagnosis not present

## 2019-11-02 DIAGNOSIS — K219 Gastro-esophageal reflux disease without esophagitis: Secondary | ICD-10-CM | POA: Diagnosis not present

## 2019-11-02 DIAGNOSIS — D122 Benign neoplasm of ascending colon: Secondary | ICD-10-CM

## 2019-11-02 DIAGNOSIS — E079 Disorder of thyroid, unspecified: Secondary | ICD-10-CM | POA: Diagnosis not present

## 2019-11-02 DIAGNOSIS — D123 Benign neoplasm of transverse colon: Secondary | ICD-10-CM | POA: Diagnosis not present

## 2019-11-02 DIAGNOSIS — Z1211 Encounter for screening for malignant neoplasm of colon: Secondary | ICD-10-CM

## 2019-11-02 DIAGNOSIS — K635 Polyp of colon: Secondary | ICD-10-CM | POA: Diagnosis not present

## 2019-11-02 MED ORDER — SODIUM CHLORIDE 0.9 % IV SOLN: 500.0000 mL | Freq: Once | INTRAVENOUS | Status: DC

## 2019-11-02 NOTE — Progress Notes (Signed)
Called to room to assist during endoscopic procedure.  Patient ID and intended procedure confirmed with present staff. Received instructions for my participation in the procedure from the performing physician.  

## 2019-11-02 NOTE — Progress Notes (Signed)
Report given to PACU, vss 

## 2019-11-02 NOTE — Progress Notes (Signed)
Pt's states no medical or surgical changes since previsit or office visit.  VS by SH. 

## 2019-11-02 NOTE — Patient Instructions (Signed)
Please read handouts provided. Continue present medications. Await pathology results.   YOU HAD AN ENDOSCOPIC PROCEDURE TODAY AT THE [facility]:   Refer to the procedure report that was given to you for any specific questions about what was found during the examination.  If the procedure report does not answer your questions, please call your gastroenterologist to clarify.  If you requested that your care partner not be given the details of your procedure findings, then the procedure report has been included in a sealed envelope for you to review at your convenience later.  YOU SHOULD EXPECT: Some feelings of bloating in the abdomen. Passage of more gas than usual.  Walking can help get rid of the air that was put into your GI tract during the procedure and reduce the bloating. If you had a lower endoscopy (such as a colonoscopy or flexible sigmoidoscopy) you may notice spotting of blood in your stool or on the toilet paper. If you underwent a bowel prep for your procedure, you may not have a normal bowel movement for a few days.  Please Note:  You might notice some irritation and congestion in your nose or some drainage.  This is from the oxygen used during your procedure.  There is no need for concern and it should clear up in a day or so.  SYMPTOMS TO REPORT IMMEDIATELY:  Following lower endoscopy (colonoscopy or flexible sigmoidoscopy):  Excessive amounts of blood in the stool  Significant tenderness or worsening of abdominal pains  Swelling of the abdomen that is new, acute  Fever of 100F or higher   For urgent or emergent issues, a gastroenterologist can be reached at any hour by calling [phone]. Do not use MyChart messaging for urgent concerns.    DIET:  We do recommend a small meal at first, but then you may proceed to your regular diet.  Drink plenty of fluids but you should avoid alcoholic beverages for 24 hours.  ACTIVITY:  You should plan to take it easy  for the rest of today and you should NOT DRIVE or use heavy machinery until tomorrow (because of the sedation medicines used during the test).    FOLLOW UP: Our staff will call the number listed on your records 48-72 hours following your procedure to check on you and address any questions or concerns that you may have regarding the information given to you following your procedure. If we do not reach you, we will leave a message.  We will attempt to reach you two times.  During this call, we will ask if you have developed any symptoms of COVID 19. If you develop any symptoms (ie: fever, flu-like symptoms, shortness of breath, cough etc.) before then, please call [phone].  If you test positive for Covid 19 in the 2 weeks post procedure, please call and report this information to us.    If any biopsies were taken you will be contacted by phone or by letter within the next 1-3 weeks.  Please call us at [phone] if you have not heard about the biopsies in 3 weeks.    SIGNATURES/CONFIDENTIALITY: You and/or your care partner have signed paperwork which will be entered into your electronic medical record.  These signatures attest to the fact that that the information above on your After Visit Summary has been reviewed and is understood.  Full responsibility of the confidentiality of this discharge information lies with you and/or your care-partner.  

## 2019-11-02 NOTE — Op Note (Signed)
[facility] Patient Name: Natalie Kelly Procedure Date: 11/02/2019 2:40 PM MRN: [id number] Endoscopist: Thornton Park MD, MD Age: 49 Referring MD:  Date of Birth: 07-16-1970 Gender: Female Account #: [id number] Procedure:                Colonoscopy Indications:              Screening for colorectal malignant neoplasm, This                            is the patient's first colonoscopy                           Father with colon polyps in his 60s                           No known family history of colon cancer Medicines:                Monitored Anesthesia Care Procedure:                Pre-Anesthesia Assessment:                           - Prior to the procedure, a History and Physical                            was performed, and patient medications and                            allergies were reviewed. The patient's tolerance of                            previous anesthesia was also reviewed. The risks                            and benefits of the procedure and the sedation                            options and risks were discussed with the patient.                            All questions were answered, and informed consent                            was obtained. Prior Anticoagulants: The patient has                            taken no previous anticoagulant or antiplatelet                            agents. ASA Grade Assessment: II - A patient with                            mild systemic disease. After reviewing the risks  and benefits, the patient was deemed in                            satisfactory condition to undergo the procedure.                           After obtaining informed consent, the colonoscope                            was passed under direct vision. Throughout the                            procedure, the patient's blood pressure, pulse, and                            oxygen saturations were monitored continuously.  The                            Colonoscope was introduced through the anus and                            advanced to the 3 cm into the ileum. A second                            forward view of the right colon was performed. The                            colonoscopy was performed without difficulty. The                            patient tolerated the procedure well. The quality                            of the bowel preparation was good. The terminal                            ileum, ileocecal valve, appendiceal orifice, and                            rectum were photographed. Scope In: 2:48:30 PM Scope Out: 3:08:13 PM Scope Withdrawal Time: 0 hours 14 minutes 46 seconds  Total Procedure Duration: 0 hours 19 minutes 43 seconds  Findings:                 The perianal and digital rectal examinations were                            normal.                           Small and large-mouthed diverticula were found in                            the sigmoid colon and descending colon.  A 3 mm polyp was found in the descending colon. The                            polyp was flat. The polyp was removed with a cold                            snare. Resection and retrieval were complete.                            Estimated blood loss was minimal.                           A 3 mm polyp was found in the ascending colon. The                            polyp was flat. The polyp was removed with a cold                            snare. Resection and retrieval were complete.                            Estimated blood loss was minimal.                           A 3 mm polyp was found in the hepatic flexure. The                            polyp was carpet-like. The polyp was removed with a                            cold snare. Resection and retrieval were complete.                            Estimated blood loss was minimal.                           The exam was otherwise  without abnormality on                            direct and retroflexion views. Complications:            No immediate complications. Estimated blood loss:                            Minimal. Estimated Blood Loss:     Estimated blood loss was minimal. Impression:               - Diverticulosis in the sigmoid colon and in the                            descending colon.                           - One 3 mm polyp in the  descending colon, removed                            with a cold snare. Resected and retrieved.                           - One 3 mm polyp in the ascending colon, removed                            with a cold snare. Resected and retrieved.                           - One 3 mm polyp at the hepatic flexure, removed                            with a cold snare. Resected and retrieved.                           - The examination was otherwise normal on direct                            and retroflexion views. Recommendation:           - Patient has a contact number available for                            emergencies. The signs and symptoms of potential                            delayed complications were discussed with the                            patient. Return to normal activities tomorrow.                            Written discharge instructions were provided to the                            patient.                           - Resume previous diet.                           - Continue present medications.                           - Await pathology results.                           - Repeat colonoscopy date to be determined after                            pending pathology results are reviewed for  surveillance.                           - Emerging evidence supports eating a diet of                            fruits, vegetables, grains, calcium, and yogurt                            while reducing red meat and alcohol may reduce the                             risk of colon cancer.                           - Thank you for allowing me to be involved in your                            colon cancer prevention. Thornton Park MD, MD 11/02/2019 3:14:30 PM This report has been signed electronically.

## 2019-11-04 ENCOUNTER — Telehealth: Payer: Self-pay

## 2019-11-04 NOTE — Telephone Encounter (Signed)
LVM

## 2019-11-04 NOTE — Telephone Encounter (Signed)
  Follow up Call-  Call back number 11/02/2019  Post procedure Call Back phone  # [phone]  Permission to leave phone message Yes  Some recent data might be hidden     Patient questions:  Do you have a fever, pain , or abdominal swelling? No. Pain Score  0 *  Have you tolerated food without any problems? Yes.    Have you been able to return to your normal activities? Yes.    Do you have any questions about your discharge instructions: Diet   No. Medications  No. Follow up visit  No.  Do you have questions or concerns about your Care? No.  Actions: * If pain score is 4 or above: No action needed, pain <4.  1. Have you developed a fever since your procedure? no  2.   Have you had an respiratory symptoms (SOB or cough) since your procedure? no  3.   Have you tested positive for COVID 19 since your procedure no  4.   Have you had any family members/close contacts diagnosed with the COVID 19 since your procedure?  no   If yes to any of these questions please route to Joylene John, RN and Joella Prince, RN

## 2019-11-05 ENCOUNTER — Encounter: Payer: Self-pay | Admitting: Gastroenterology

## 2019-11-16 ENCOUNTER — Ambulatory Visit: Payer: 59 | Admitting: Internal Medicine

## 2019-11-16 ENCOUNTER — Encounter: Payer: Self-pay | Admitting: Internal Medicine

## 2019-11-16 ENCOUNTER — Other Ambulatory Visit: Payer: Self-pay

## 2019-11-16 ENCOUNTER — Other Ambulatory Visit: Payer: Self-pay | Admitting: Internal Medicine

## 2019-11-16 VITALS — BP 120/90 | HR 74 | Ht 64.0 in | Wt 174.0 lb

## 2019-11-16 DIAGNOSIS — E782 Mixed hyperlipidemia: Secondary | ICD-10-CM

## 2019-11-16 DIAGNOSIS — E039 Hypothyroidism, unspecified: Secondary | ICD-10-CM | POA: Diagnosis not present

## 2019-11-16 DIAGNOSIS — Z566 Other physical and mental strain related to work: Secondary | ICD-10-CM

## 2019-11-16 DIAGNOSIS — Z87898 Personal history of other specified conditions: Secondary | ICD-10-CM | POA: Diagnosis not present

## 2019-11-16 DIAGNOSIS — F419 Anxiety disorder, unspecified: Secondary | ICD-10-CM | POA: Diagnosis not present

## 2019-11-16 DIAGNOSIS — Z8669 Personal history of other diseases of the nervous system and sense organs: Secondary | ICD-10-CM

## 2019-11-16 MED ORDER — MELOXICAM 15 MG PO TABS: 15.0000 mg | ORAL_TABLET | Freq: Every day | ORAL | 1 refills | Status: DC

## 2019-11-16 MED FILL — MELOXICAM 15 MG TABLET: 15 | 30 days supply | Qty: 30 | Fill #0

## 2019-11-16 NOTE — Progress Notes (Signed)
   Subjective:    Patient ID: Natalie Kelly, female    DOB: Jun 23, 1970, 49 y.o.   MRN: [id number]  HPI 49 year old Female seen for hypothyroidism and history of migraine headaches.  She had colonoscopy in September and had precancerous polyps known as sessile serrated polyps.  They want to do a repeat colonoscopy in 3 years  Her lipid panel is entirely normal.  She had normal liver panel in August.  Remains on Xanax up to twice daily as needed for anxiety.  History of migraine headaches treated with as needed Fioricet.  She is on Zetia for hyperlipidemia.  History of elevated liver enzymes previously on statin therapy.  History of mixed hyperlipidemia.  Dr. Debara Pickett saw her in July and recommended pravastatin 20 mg daily.  She takes meloxicam for musculoskeletal pain.  Takes as needed Imitrex for migraine headaches.  Also on Topamax 50 mg daily for migraine headaches.  Is on Synthroid 0.112 mg for hypothyroidism.  Liver functions in August were normal.  Lipid panel checked October 12 is now normal.  Hemoglobin A1c in March was 5.7%.  Considerable work stress due to the pandemic but handling it well  Review of Systems see above no new complaints     Objective:   Physical Exam Blood pressure 120/90, pulse 74, pulse oximetry 97% weight 174 pounds BMI 29.87  Neck is supple without thyromegaly or carotid bruits.  Chest clear to auscultation.  Cardiac exam regular rate and rhythm.  No lower extremity edema.       Assessment & Plan:  History of migraine headaches-treated with Topamax Imitrex  Mixed hyperlipidemia treated with Pravachol and Zetia by Dr. Debara Pickett and stable.  Improvement noted with Pravachol.  Impaired glucose tolerance with hemoglobin A1c 5.7% in March 2021  Work stress  Hypothyroidism stable on thyroid replacement medication  Anxiety treated sparingly with Xanax low-dose  Plan: CPE scheduled for April 2022.

## 2019-11-17 MED FILL — PRAVASTATIN SODIUM 20 MG TA: 20 | 90 days supply | Qty: 90 | Fill #1

## 2019-11-23 DIAGNOSIS — E785 Hyperlipidemia, unspecified: Secondary | ICD-10-CM | POA: Diagnosis not present

## 2019-11-23 LAB — LIPID PANEL
Chol/HDL Ratio: 3.7 ratio (ref 0.0–4.4)
Cholesterol, Total: 149 mg/dL (ref 100–199)
HDL: 40 mg/dL (ref >39)
LDL Chol Calc (NIH): 83 mg/dL (ref 0–99)
Triglycerides: 148 mg/dL (ref 0–149)
VLDL Cholesterol Cal: 26 mg/dL (ref 5–40)

## 2019-11-23 MED FILL — EZETIMIBE 10 MG TABS: 10 | 90 days supply | Qty: 90 | Fill #1

## 2019-11-26 ENCOUNTER — Encounter: Payer: Self-pay | Admitting: Internal Medicine

## 2019-11-26 ENCOUNTER — Telehealth (INDEPENDENT_AMBULATORY_CARE_PROVIDER_SITE_OTHER): Payer: 59 | Admitting: Internal Medicine

## 2019-11-26 VITALS — Ht 63.0 in | Wt 172.4 lb

## 2019-11-26 DIAGNOSIS — E785 Hyperlipidemia, unspecified: Secondary | ICD-10-CM

## 2019-11-26 DIAGNOSIS — R748 Abnormal levels of other serum enzymes: Secondary | ICD-10-CM | POA: Diagnosis not present

## 2019-11-26 NOTE — Patient Instructions (Signed)
Medication Instructions:  Your physician recommends that you continue on your current medications as directed. Please refer to the Current Medication list given to you today.  *If you need a refill on your cardiac medications before your next appointment, please call your pharmacy*   Lab Work: FASTING lab work in 6 months to check cholesterol/liver enzymes  If you have labs (blood work) drawn today and your tests are completely normal, you will receive your results only by: Marland Kitchen MyChart Message (if you have MyChart) OR . A paper copy in the mail If you have any lab test that is abnormal or we need to change your treatment, we will call you to review the results.   Testing/Procedures: NONE   Follow-Up: At [facility], you and your health needs are our priority.  As part of our continuing mission to provide you with exceptional heart care, we have created designated Provider Care Teams.  These Care Teams include your primary Cardiologist (physician) and Advanced Practice Providers (APPs -  Physician Assistants and Nurse Practitioners) who all work together to provide you with the care you need, when you need it.  We recommend signing up for the patient portal called "MyChart".  Sign up information is provided on this After Visit Summary.  MyChart is used to connect with patients for Virtual Visits (Telemedicine).  Patients are able to view lab/test results, encounter notes, upcoming appointments, etc.  Non-urgent messages can be sent to your provider as well.   To learn more about what you can do with MyChart, go to [email or web address].    Your next appointment:   6 month(s) - lipid clinic  The format for your next appointment:   In Person or Virtual   Provider:   K. Mali Hilty, MD   Other Instructions

## 2019-11-26 NOTE — Progress Notes (Signed)
Virtual Visit via Telephone Note   This visit type was conducted due to national recommendations for restrictions regarding the COVID-19 Pandemic (e.g. social distancing) in an effort to limit this patient's exposure and mitigate transmission in our community.  Due to her co-morbid illnesses, this patient is at least at moderate risk for complications without adequate follow up.  This format is felt to be most appropriate for this patient at this time.  The patient did not have access to video technology/had technical difficulties with video requiring transitioning to audio format only (telephone).  All issues noted in this document were discussed and addressed.  No physical exam could be performed with this format.  Please refer to the patient's chart for her  consent to telehealth for [facility].   Date:  11/26/2019   ID:  Natalie Kelly, DOB 1970-12-06, MRN [id number] The patient was identified using 2 identifiers.  Evaluation Performed:  Follow-Up Visit  Patient Location:  [address] [place] [address]  Provider location:   [address], [address] [place], [place] [address]  PCP:  Elby Showers, MD  Cardiologist:  Pixie Casino, MD Electrophysiologist:  None   Chief Complaint:  Manage dyslipidemia  History of Present Illness:    Natalie Kelly is a 49 y.o. female who presents via audio/video conferencing for a telehealth visit today.  This is a pleasant 49 year old female who currently works as a Tourist information centre manager at [employer] with a history of dyslipidemia, thyroid disease and family history of early stroke in her father.  In the past she had been on statin therapy however developed elevated liver enzymes for which she discontinued it in 2019.  She was on rosuvastatin 5 mg daily which was then reduced to every other day.  Her ALT was increased to 61 and then 65, subsequently returning to normal a month later after discontinuing her statin.  Ultimately then she was  placed on ezetimibe.  Her most recent lipid showed total cholesterol 208, triglycerides 202, HDL 48 and LDL 126.  She was referred for further evaluation recommendations for therapy with a preference for statin for greater cardiovascular risk reduction.  She reports overall fairly healthy diet, denies any alcohol tobacco use and tries to remain active but finds difficulty in exercising regularly.  11/26/2019  Natalie Kelly is seen today via telephone follow-up.  Overall she seems to be tolerating pravastatin although she does get some mild joint discomfort.  She says it is tolerable and has had significant improvement in her numbers.  Currently her total cholesterol is 149, triglycerides 148, HDL 40 and LDL of 83.  The patient does not have symptoms concerning for COVID-19 infection (fever, chills, cough, or new SHORTNESS OF BREATH).    Prior CV studies:   The following studies were reviewed today:  Chart reviewed  PMHx:  Past Medical History:  Diagnosis Date  . Allergy    seasonal allergies  . Anxiety   . Arthritis    toes  . Depression   . GERD (gastroesophageal reflux disease)    with certain foods/times of days-as needed meds  . Hyperlipidemia   . Migraine headache   . Seborrhea    ear  . Thyroid disease    hypothyroidism    Past Surgical History:  Procedure Laterality Date  . CESAREAN SECTION  2001 AND 2004  . ORIF PATELLA FRACTURE Right 2008  . WISDOM TOOTH EXTRACTION  1990    FAMHx:  Family History  Problem  Relation Age of Onset  . Stroke Father   . Hypertension Father   . Hyperlipidemia Father   . Diabetes Father   . Colon polyps Father 31  . Hyperlipidemia Mother   . Asthma Daughter   . Congestive Heart Failure Maternal Grandmother   . Parkinson's disease Paternal Grandmother   . Colon cancer Neg Hx   . Esophageal cancer Neg Hx   . Rectal cancer Neg Hx   . Stomach cancer Neg Hx     SOCHx:   reports that she has never smoked. She has never used  smokeless tobacco. She reports that she does not drink alcohol and does not use drugs.  ALLERGIES:  No Known Allergies  MEDS:  No outpatient medications have been marked as taking for the 11/26/19 encounter (Video Visit) with Pixie Casino, MD.     ROS: Pertinent items noted in HPI and remainder of comprehensive ROS otherwise negative.  Labs/Other Tests and Data Reviewed:    Recent Labs: 05/11/2019: BUN 17; Creat 0.82; Hemoglobin 14.1; Platelets 363; Potassium 5.0; Sodium 139; TSH 1.25 09/21/2019: ALT 23   Recent Lipid Panel Lab Results  Component Value Date/Time   CHOL 149 11/23/2019 08:25 AM   TRIG 148 11/23/2019 08:25 AM   HDL 40 11/23/2019 08:25 AM   CHOLHDL 3.7 11/23/2019 08:25 AM   CHOLHDL 4.3 05/11/2019 09:59 AM   LDLCALC 83 11/23/2019 08:25 AM   LDLCALC 126 (H) 05/11/2019 09:59 AM    Wt Readings from Last 3 Encounters:  11/26/19 172 lb 6.4 oz (78.2 kg)  11/16/19 174 lb (78.9 kg)  11/02/19 178 lb (80.7 kg)     Exam:    Vital Signs:  Ht 5\' 3"  (1.6 m)   Wt 172 lb 6.4 oz (78.2 kg)   BMI 30.54 kg/m    Exam not performed due to telephone visit  ASSESSMENT & PLAN:    1. Mixed dyslipidemia 2. Family history of premature stroke 3. History of elevated liver enzymes  Natalie Kelly has had a marked improvement in her lipids on Pravachol in addition to her ezetimibe.  She might have some mild myalgias but she says that they are tolerable.  Her cholesterol is lower than a target LDL less than 100.  It is feasible she might be able to decrease her pravastatin to 10 mg daily and may have some symptomatic improvement.  Her liver enzymes are normal and have not increased on therapy.  I would recommend repeating lipids in about 6 months with liver enzymes and follow-up with me at that time.  COVID-19 Education: The signs and symptoms of COVID-19 were discussed with the patient and how to seek care for testing (follow up with PCP or arrange E-visit).  The importance of  social distancing was discussed today.  Patient Risk:   After full review of this patients clinical status, I feel that they are at least moderate risk at this time.  Time:   Today, I have spent 15 minutes with the patient with telehealth technology discussing dyslipidemia.     Medication Adjustments/Labs and Tests Ordered: Current medicines are reviewed at length with the patient today.  Concerns regarding medicines are outlined above.   Tests Ordered: Orders Placed This Encounter  Procedures  . Lipid panel  . Hepatic function panel    Medication Changes: No orders of the defined types were placed in this encounter.   Disposition:  in 6 month(s)  Pixie Casino, MD, [facility], [place]  [facility]  Medical Director of the [facility] Lipid [facility] &  Cardiovascular Risk Reduction Clinic Diplomate of the American Board of Clinical Lipidology Attending Cardiologist  Direct Dial: [phone]  Fax: [phone]  Website:  [email or web address]  Pixie Casino, MD  11/26/2019 11:49 AM

## 2019-12-02 ENCOUNTER — Other Ambulatory Visit: Payer: Self-pay | Admitting: Internal Medicine

## 2019-12-02 MED FILL — TOPIRAMATE 50 MG TABLET: 50 | 90 days supply | Qty: 90 | Fill #0

## 2019-12-10 NOTE — Patient Instructions (Addendum)
It was a pleasure to see you today.  Continue current medications by Dr. Debara Pickett.  Continue to work on diet and exercise.  Continue thyroid replacement medication and Xanax sparingly.  Physical exam scheduled for April 2022.

## 2019-12-16 ENCOUNTER — Other Ambulatory Visit: Payer: Self-pay | Admitting: Internal Medicine

## 2019-12-16 MED FILL — SYNTHROID 112 MCG TABLET: 112 | 90 days supply | Qty: 90 | Fill #0

## 2020-01-04 DIAGNOSIS — N959 Unspecified menopausal and perimenopausal disorder: Secondary | ICD-10-CM | POA: Diagnosis not present

## 2020-02-16 ENCOUNTER — Other Ambulatory Visit: Payer: Self-pay | Admitting: Internal Medicine

## 2020-02-16 MED FILL — EZETIMIBE 10 MG TABS: 10 | 90 days supply | Qty: 90 | Fill #0

## 2020-02-16 MED FILL — PRAVASTATIN SODIUM 20 MG TA: 20 | 90 days supply | Qty: 90 | Fill #2

## 2020-02-21 DIAGNOSIS — M7501 Adhesive capsulitis of right shoulder: Secondary | ICD-10-CM | POA: Diagnosis not present

## 2020-02-21 DIAGNOSIS — M25512 Pain in left shoulder: Secondary | ICD-10-CM | POA: Diagnosis not present

## 2020-02-21 DIAGNOSIS — M25511 Pain in right shoulder: Secondary | ICD-10-CM | POA: Diagnosis not present

## 2020-02-21 DIAGNOSIS — M7502 Adhesive capsulitis of left shoulder: Secondary | ICD-10-CM | POA: Diagnosis not present

## 2020-02-25 ENCOUNTER — Telehealth: Payer: Self-pay | Admitting: Internal Medicine

## 2020-02-25 DIAGNOSIS — E785 Hyperlipidemia, unspecified: Secondary | ICD-10-CM

## 2020-02-25 NOTE — Telephone Encounter (Signed)
Patient wanted to know if we could mail her lab slips for her Lipid labs. She would like to have them done at her PCP Office 05/19/20 when she has her annual labs done.   Mailing address has been verified

## 2020-02-25 NOTE — Addendum Note (Signed)
Addended by: Raiford Simmonds on: 02/25/2020 05:14 PM   Modules accepted: Orders

## 2020-02-25 NOTE — Telephone Encounter (Signed)
Spoke with patient. Lab slips to be mailed out today. Patient verbalized understanding and reports she will have labs done in April, 2022.

## 2020-02-29 DIAGNOSIS — M25512 Pain in left shoulder: Secondary | ICD-10-CM | POA: Diagnosis not present

## 2020-02-29 DIAGNOSIS — M25511 Pain in right shoulder: Secondary | ICD-10-CM | POA: Diagnosis not present

## 2020-03-06 DIAGNOSIS — M25512 Pain in left shoulder: Secondary | ICD-10-CM | POA: Diagnosis not present

## 2020-03-06 DIAGNOSIS — M25511 Pain in right shoulder: Secondary | ICD-10-CM | POA: Diagnosis not present

## 2020-03-09 ENCOUNTER — Other Ambulatory Visit: Payer: Self-pay | Admitting: Internal Medicine

## 2020-03-09 MED FILL — SYNTHROID 112 MCG TABLET: 112 | 90 days supply | Qty: 90 | Fill #0

## 2020-03-09 MED FILL — TOPIRAMATE 50 MG TABLET: 50 | 90 days supply | Qty: 90 | Fill #1

## 2020-03-15 DIAGNOSIS — M25512 Pain in left shoulder: Secondary | ICD-10-CM | POA: Diagnosis not present

## 2020-03-15 DIAGNOSIS — M25511 Pain in right shoulder: Secondary | ICD-10-CM | POA: Diagnosis not present

## 2020-03-22 DIAGNOSIS — M25512 Pain in left shoulder: Secondary | ICD-10-CM | POA: Diagnosis not present

## 2020-03-22 DIAGNOSIS — M25511 Pain in right shoulder: Secondary | ICD-10-CM | POA: Diagnosis not present

## 2020-03-29 DIAGNOSIS — M25511 Pain in right shoulder: Secondary | ICD-10-CM | POA: Diagnosis not present

## 2020-03-29 DIAGNOSIS — M25512 Pain in left shoulder: Secondary | ICD-10-CM | POA: Diagnosis not present

## 2020-04-03 DIAGNOSIS — M7501 Adhesive capsulitis of right shoulder: Secondary | ICD-10-CM | POA: Diagnosis not present

## 2020-04-03 DIAGNOSIS — M7502 Adhesive capsulitis of left shoulder: Secondary | ICD-10-CM | POA: Diagnosis not present

## 2020-04-06 DIAGNOSIS — N959 Unspecified menopausal and perimenopausal disorder: Secondary | ICD-10-CM | POA: Diagnosis not present

## 2020-05-03 ENCOUNTER — Other Ambulatory Visit (HOSPITAL_COMMUNITY): Payer: Self-pay | Admitting: Obstetrics and Gynecology

## 2020-05-03 MED FILL — ELINEST 0.3-30 MG-MCG TABS: 0.3-30 | 84 days supply | Qty: 84 | Fill #0

## 2020-05-19 ENCOUNTER — Other Ambulatory Visit: Payer: 59 | Admitting: Internal Medicine

## 2020-05-19 ENCOUNTER — Other Ambulatory Visit: Payer: Self-pay

## 2020-05-19 DIAGNOSIS — E782 Mixed hyperlipidemia: Secondary | ICD-10-CM

## 2020-05-19 DIAGNOSIS — Z87898 Personal history of other specified conditions: Secondary | ICD-10-CM

## 2020-05-19 DIAGNOSIS — F419 Anxiety disorder, unspecified: Secondary | ICD-10-CM

## 2020-05-19 DIAGNOSIS — E039 Hypothyroidism, unspecified: Secondary | ICD-10-CM

## 2020-05-19 DIAGNOSIS — Z8669 Personal history of other diseases of the nervous system and sense organs: Secondary | ICD-10-CM | POA: Diagnosis not present

## 2020-05-19 DIAGNOSIS — Z566 Other physical and mental strain related to work: Secondary | ICD-10-CM

## 2020-05-19 DIAGNOSIS — Z0000 Encounter for general adult medical examination without abnormal findings: Secondary | ICD-10-CM

## 2020-05-20 LAB — COMPLETE METABOLIC PANEL WITH GFR
AG Ratio: 1.4 (calc) (ref 1.0–2.5)
ALT: 18 U/L (ref 6–29)
AST: 18 U/L (ref 10–35)
Albumin: 4.3 g/dL (ref 3.6–5.1)
Alkaline phosphatase (APISO): 62 U/L (ref 37–153)
BUN: 12 mg/dL (ref 7–25)
CO2: 22 mmol/L (ref 20–32)
Calcium: 9.5 mg/dL (ref 8.6–10.4)
Chloride: 109 mmol/L (ref 98–110)
Creat: 0.76 mg/dL (ref 0.50–1.05)
GFR, Est African American: 105 mL/min/{1.73_m2} (ref >=60)
GFR, Est Non African American: 91 mL/min/{1.73_m2} (ref >=60)
Globulin: 3 g/dL (calc) (ref 1.9–3.7)
Glucose, Bld: 100 mg/dL — ABNORMAL HIGH (ref 65–99)
Potassium: 4.9 mmol/L (ref 3.5–5.3)
Sodium: 141 mmol/L (ref 135–146)
Total Bilirubin: 0.4 mg/dL (ref 0.2–1.2)
Total Protein: 7.3 g/dL (ref 6.1–8.1)

## 2020-05-20 LAB — VITAMIN D 25 HYDROXY (VIT D DEFICIENCY, FRACTURES): Vit D, 25-Hydroxy: 38 ng/mL (ref 30–100)

## 2020-05-20 LAB — CBC WITH DIFFERENTIAL/PLATELET
Absolute Monocytes: 731 cells/uL (ref 200–950)
Basophils Absolute: 78 cells/uL (ref 0–200)
Basophils Relative: 0.9 %
Eosinophils Absolute: 148 cells/uL (ref 15–500)
Eosinophils Relative: 1.7 %
HCT: 43.5 % (ref 35.0–45.0)
Hemoglobin: 13.9 g/dL (ref 11.7–15.5)
Lymphs Abs: 2349 cells/uL (ref 850–3900)
MCH: 27.2 pg (ref 27.0–33.0)
MCHC: 32 g/dL (ref 32.0–36.0)
MCV: 85.1 fL (ref 80.0–100.0)
MPV: 10 fL (ref 7.5–12.5)
Monocytes Relative: 8.4 %
Neutro Abs: 5394 cells/uL (ref 1500–7800)
Neutrophils Relative %: 62 %
Platelets: 398 10*3/uL (ref 140–400)
RBC: 5.11 10*6/uL — ABNORMAL HIGH (ref 3.80–5.10)
RDW: 12.4 % (ref 11.0–15.0)
Total Lymphocyte: 27 %
WBC: 8.7 10*3/uL (ref 3.8–10.8)

## 2020-05-20 LAB — LIPID PANEL
Cholesterol: 148 mg/dL (ref <200)
HDL: 54 mg/dL (ref >=50)
LDL Cholesterol (Calc): 75 mg/dL (calc)
Non-HDL Cholesterol (Calc): 94 mg/dL (calc) (ref <130)
Total CHOL/HDL Ratio: 2.7 (calc) (ref <5.0)
Triglycerides: 103 mg/dL (ref <150)

## 2020-05-20 LAB — HEMOGLOBIN A1C
Hgb A1c MFr Bld: 6 % of total Hgb — ABNORMAL HIGH (ref <5.7)
Mean Plasma Glucose: 126 mg/dL
eAG (mmol/L): 7 mmol/L

## 2020-05-20 LAB — TSH: TSH: 0.33 mIU/L — ABNORMAL LOW

## 2020-05-23 ENCOUNTER — Ambulatory Visit (INDEPENDENT_AMBULATORY_CARE_PROVIDER_SITE_OTHER): Payer: 59 | Admitting: Internal Medicine

## 2020-05-23 ENCOUNTER — Other Ambulatory Visit: Payer: Self-pay

## 2020-05-23 ENCOUNTER — Encounter: Payer: Self-pay | Admitting: Internal Medicine

## 2020-05-23 VITALS — BP 120/88 | HR 82 | Ht 64.0 in | Wt 174.0 lb

## 2020-05-23 DIAGNOSIS — M7501 Adhesive capsulitis of right shoulder: Secondary | ICD-10-CM | POA: Diagnosis not present

## 2020-05-23 DIAGNOSIS — Z6829 Body mass index (BMI) 29.0-29.9, adult: Secondary | ICD-10-CM

## 2020-05-23 DIAGNOSIS — M7502 Adhesive capsulitis of left shoulder: Secondary | ICD-10-CM

## 2020-05-23 DIAGNOSIS — F439 Reaction to severe stress, unspecified: Secondary | ICD-10-CM

## 2020-05-23 DIAGNOSIS — Z87898 Personal history of other specified conditions: Secondary | ICD-10-CM | POA: Diagnosis not present

## 2020-05-23 DIAGNOSIS — E782 Mixed hyperlipidemia: Secondary | ICD-10-CM

## 2020-05-23 DIAGNOSIS — E039 Hypothyroidism, unspecified: Secondary | ICD-10-CM | POA: Diagnosis not present

## 2020-05-23 DIAGNOSIS — F419 Anxiety disorder, unspecified: Secondary | ICD-10-CM | POA: Diagnosis not present

## 2020-05-23 DIAGNOSIS — Z8669 Personal history of other diseases of the nervous system and sense organs: Secondary | ICD-10-CM

## 2020-05-23 DIAGNOSIS — J301 Allergic rhinitis due to pollen: Secondary | ICD-10-CM

## 2020-05-23 DIAGNOSIS — Z0000 Encounter for general adult medical examination without abnormal findings: Secondary | ICD-10-CM | POA: Diagnosis not present

## 2020-05-23 LAB — POCT URINALYSIS DIPSTICK
Appearance: NEGATIVE
Bilirubin, UA: NEGATIVE
Blood, UA: NEGATIVE
Glucose, UA: NEGATIVE
Ketones, UA: NEGATIVE
Leukocytes, UA: NEGATIVE
Nitrite, UA: NEGATIVE
Odor: NEGATIVE
Protein, UA: NEGATIVE
Spec Grav, UA: 1.015 (ref 1.010–1.025)
Urobilinogen, UA: 0.2 E.U./dL
pH, UA: 6.5 (ref 5.0–8.0)

## 2020-05-23 NOTE — Progress Notes (Signed)
   Subjective:    Patient ID: Natalie Kelly, female    DOB: 1970/11/15, 50 y.o.   MRN: [id number]  HPI  50 year old Female seen for health maintenance exam and evaluation of medical issues.  History of hypothyroidism, hyperlipidemia and migraine headaches. Migraines are treated with as needed Fioricet, Imitrex.  She is on Topamax as a preventative.  Takes Mobic for musculoskeletal pain.  History of hyperlipidemia treated with Zetia.  History of mild anxiety treated with as needed Xanax 0.25 mg twice daily.  Hyperlipidemia was diagnosed in 2010.  History of seborrhea in the ears treated with as needed Cortisporin otic suspension.  Dr. Radene Knee is GYN physician.  See below regarding perimenopause.  History of right patellar fracture in 2008.  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 Got injection in the left shoulder.  Received COVID immunizations and flu vaccine through employment.  Social history: She is married.  Does not smoke or consume alcohol.  She is an Therapist, sports.  She now works as a Tourist information centre manager.  Family history: Father with history of stroke, hypertension and headaches.  Mother with history of headaches.  1 brother with history of headaches.     Objective:   Physical Exam Blood pressure 120/88 pulse 82 pulse oximetry 98% weight 174 pounds BMI 29.87  Skin: Warm and dry nodes: None.  TMs are clear.  Neck is supple without thyromegaly JVD or carotid bruits.  Chest is clear to auscultation without rales or wheezing.  Cardiac exam: Regular rate and rhythm, normal S1 and S2 without murmurs or gallops.  Abdomen: Soft nondistended without hepatosplenomegaly, masses, or tenderness.  Pelvic exam deferred to GYN physician.  Neurological exam is intact without focal deficits.  Affect, thought and judgment are normal.  No lower extremity  pitting edema.       Assessment & Plan:  Mild anxiety related to COVID-19 pandemic and work stress.  Okay to continue Xanax 0.25 mg as needed up to twice daily for anxiety  History of migraine headaches treated with Topamax, as needed Fioricet and Imitrex  Mixed hyperlipidemia-seen by Dr. Debara Pickett and is now on pravastatin 20 mg daily.  She has a history of elevated liver enzymes some previous statin therapy but liver enzymes are normal on this regimen.  Allergic rhinitis treated with Zyrtec  Hypothyroidism stable with Synthroid 0.112 mg daily  Musculoskeletal pain treated with Mobic  History of bilateral adhesive capsulitis treated by Dr. Onnie Graham  Health maintenance-had colonoscopy in 2021 by Dr. Tarri Glenn  Allergic rhinitis treated with Zyrtec  Impaired glucose tolerance as she has not wanted to be on metformin.  Continue diet and exercise efforts.  BMI 29.87-improved from 2021 when it was 31.18.  Continue with diet and exercise issues.  Plan: Continue current medications and return in 1 year or as needed.

## 2020-05-25 ENCOUNTER — Other Ambulatory Visit (HOSPITAL_COMMUNITY): Payer: Self-pay

## 2020-05-25 MED FILL — Ezetimibe Tab 10 MG: ORAL | 90 days supply | Qty: 90 | Fill #0 | Status: AC

## 2020-06-09 ENCOUNTER — Other Ambulatory Visit (HOSPITAL_COMMUNITY): Payer: Self-pay

## 2020-06-09 ENCOUNTER — Other Ambulatory Visit: Payer: Self-pay | Admitting: Internal Medicine

## 2020-06-09 MED ORDER — TOPIRAMATE 50 MG PO TABS
50.0000 mg | ORAL_TABLET | Freq: Every day | ORAL | 2 refills | Status: DC
  Filled 2020-06-09: qty 90, 90d supply, fill #0
  Filled 2020-09-04: qty 90, 90d supply, fill #1
  Filled 2020-11-30: qty 90, 90d supply, fill #2

## 2020-06-09 MED ORDER — SYNTHROID 112 MCG PO TABS
112.0000 ug | ORAL_TABLET | Freq: Every day | ORAL | 2 refills | Status: DC
  Filled 2020-06-09: qty 90, 90d supply, fill #0
  Filled 2020-09-04: qty 90, 90d supply, fill #1

## 2020-06-12 ENCOUNTER — Other Ambulatory Visit: Payer: Self-pay

## 2020-06-12 ENCOUNTER — Encounter: Payer: Self-pay | Admitting: Internal Medicine

## 2020-06-12 ENCOUNTER — Ambulatory Visit (INDEPENDENT_AMBULATORY_CARE_PROVIDER_SITE_OTHER): Payer: 59 | Admitting: Internal Medicine

## 2020-06-12 ENCOUNTER — Other Ambulatory Visit (HOSPITAL_COMMUNITY): Payer: Self-pay

## 2020-06-12 VITALS — BP 115/78 | HR 82 | Ht 63.0 in | Wt 174.0 lb

## 2020-06-12 DIAGNOSIS — E785 Hyperlipidemia, unspecified: Secondary | ICD-10-CM | POA: Diagnosis not present

## 2020-06-12 DIAGNOSIS — R748 Abnormal levels of other serum enzymes: Secondary | ICD-10-CM

## 2020-06-12 MED ORDER — PRAVASTATIN SODIUM 10 MG PO TABS
10.0000 mg | ORAL_TABLET | Freq: Every day | ORAL | 3 refills | Status: DC
  Filled 2020-06-12: qty 90, 90d supply, fill #0
  Filled 2020-11-17: qty 90, 90d supply, fill #1
  Filled 2021-02-15: qty 90, 90d supply, fill #2
  Filled 2021-05-16: qty 90, 90d supply, fill #3

## 2020-06-12 NOTE — Patient Instructions (Signed)
Medication Instructions:  PRESCRIPTION FOR 10MG  TABLETS OF PRAVASTATIN HAVE BEEN CALLED INTO THE PHARMACY *If you need a refill on your cardiac medications before your next appointment, please call your pharmacy*  Lab Work: LIPID&CMET- IN 3 MONTHS- THIS WILL NEED TO BE DONE AFTER FASTING  If you have labs (blood work) drawn today and your tests are completely normal, you will receive your results only by: Marland Kitchen MyChart Message (if you have MyChart) OR . A paper copy in the mail If you have any lab test that is abnormal or we need to change your treatment, we will call you to review the results.  Follow-Up: At [facility], you and your health needs are our priority.  As part of our continuing mission to provide you with exceptional heart care, we have created designated Provider Care Teams.  These Care Teams include your primary Cardiologist (physician) and Advanced Practice Providers (APPs -  Physician Assistants and Nurse Practitioners) who all work together to provide you with the care you need, when you need it.  Your next appointment:   6 month(s) LIPID CLINIC   The format for your next appointment:   In Person  Provider:   Raliegh Ip Mali Hilty, MD

## 2020-06-14 ENCOUNTER — Other Ambulatory Visit (HOSPITAL_COMMUNITY): Payer: Self-pay

## 2020-06-15 ENCOUNTER — Other Ambulatory Visit (HOSPITAL_COMMUNITY): Payer: Self-pay

## 2020-06-15 MED ORDER — ZOSTER VAC RECOMB ADJUVANTED 50 MCG/0.5ML IM SUSR
0.5000 mL | Freq: Once | INTRAMUSCULAR | 1 refills | Status: AC
  Filled 2020-06-22: qty 0.5, 1d supply, fill #0
  Filled 2020-09-22: qty 0.5, 1d supply, fill #1

## 2020-06-15 NOTE — Progress Notes (Signed)
LIPID CLINIC CONSULT NOTE  Chief Complaint:  Manage dyslipidemia  Primary Care Physician: Elby Showers, MD  Primary Cardiologist:  Pixie Casino, MD  HPI:  Natalie Kelly is a 50 y.o. female who is being seen today for the evaluation of dyslipidemia at the request of Baxley, Cresenciano Lick, MD.  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  In the past she had been on statin therapy however developed elevated liver enzymes for which she discontinued it in 2019.  She was on rosuvastatin 5 mg daily which was then reduced to every other day.  Her ALT was increased to 61 and then 65, subsequently returning to normal a month later after discontinuing her statin.  Ultimately then she was placed on ezetimibe.  Her most recent lipid showed total cholesterol 208, triglycerides 202, HDL 48 and LDL 126.  She was referred for further evaluation recommendations for therapy with a preference for statin for greater cardiovascular risk reduction.  She reports overall fairly healthy diet, denies any alcohol tobacco use and tries to remain active but finds difficulty in exercising regularly.  06/12/2020  [name] returns today for follow-up of dyslipidemia.  She had recent lipids which showed marked improvement in her numbers.  Total cholesterol now 148, HDL 54, triglycerides 103 and LDL 75, down from 126 about a year ago.  Overall this represents very good treatment.  She is currently on the 10 mg pravastatin daily.  I had recently decreased the dose and she seems to be tolerating that just fine.  PMHx:  Past Medical History:  Diagnosis Date  . Allergy    seasonal allergies  . Anxiety   . Arthritis    toes  . Depression   . GERD (gastroesophageal reflux disease)    with certain foods/times of days-as needed meds  . Hyperlipidemia   . Migraine headache   .  Seborrhea    ear  . Thyroid disease    hypothyroidism    Past Surgical History:  Procedure Laterality Date  . CESAREAN SECTION  2001 AND 2004  . ORIF PATELLA FRACTURE Right 2008  . WISDOM TOOTH EXTRACTION  1990    FAMHx:  Family History  Problem Relation Age of Onset  . Stroke Father   . Hypertension Father   . Hyperlipidemia Father   . Diabetes Father   . Colon polyps Father 87  . Hyperlipidemia Mother   . Asthma Daughter   . Congestive Heart Failure Maternal Grandmother   . Parkinson's disease Paternal Grandmother   . Colon cancer Neg Hx   . Esophageal cancer Neg Hx   . Rectal cancer Neg Hx   . Stomach cancer Neg Hx     SOCHx:   reports that she has never smoked. She has never used smokeless tobacco. She reports that she does not drink alcohol and does not use drugs.  ALLERGIES:  No Known Allergies  ROS: Pertinent items noted in HPI and remainder of comprehensive ROS otherwise negative.  HOME MEDS: Current Outpatient Medications on File Prior to Visit  Medication Sig Dispense Refill  . ALPRAZolam (XANAX) 0.25 MG tablet Take 1 tablet (0.25 mg total) by mouth 2 (two) times daily as needed for anxiety. 20 tablet 5  . butalbital-acetaminophen-caffeine (FIORICET) 50-325-40 MG tablet One po q 4-6 hours prn migraine headaches not to exceed 6 tabs  in 24 hours 30 tablet 2  . cetirizine (ZYRTEC) 10 MG chewable tablet Chew 10 mg by mouth daily.    . Cholecalciferol (VITAMIN D) 2000 UNITS CAPS Take by mouth. Reported on 07/20/2015    . ELDERBERRY PO Take by mouth.     Marland Kitchen ELINEST 0.3-30 MG-MCG tablet Take 1 tablet by mouth daily.    Marland Kitchen EPINEPHrine 0.3 mg/0.3 mL IJ SOAJ injection as needed.     . ezetimibe (ZETIA) 10 MG tablet TAKE 1 TABLET BY MOUTH DAILY. 90 tablet 1  . IBUPROFEN PO Take by mouth as needed.    . meloxicam (MOBIC) 15 MG tablet TAKE 1 TABLET (15 MG TOTAL) BY MOUTH DAILY. 30 tablet 1  . NEOMYCIN-POLYMYXIN-HYDROCORTISONE (CORTISPORIN) 1 % SOLN OTIC solution Place 3  drops into both ears 4 (four) times daily. 10 mL 0  . raNITIdine HCl (ZANTAC PO) Take by mouth as needed.    . SUMAtriptan (IMITREX) 20 MG/ACT nasal spray Use as directed for migraine headache 1 Inhaler 3  . SYNTHROID 112 MCG tablet TAKE 1 TABLET (112 MCG TOTAL) BY MOUTH DAILY. 90 tablet 2  . topiramate (TOPAMAX) 50 MG tablet Take 1 tablet (50 mg total) by mouth daily. 90 tablet 2  . TURMERIC PO Take 1,500 mg by mouth daily. Reported on 07/20/2015     No current facility-administered medications on file prior to visit.    LABS/IMAGING: No results found for this or any previous visit (from the past 48 hour(s)). No results found.  LIPID PANEL:    Component Value Date/Time   CHOL 148 05/19/2020 1043   CHOL 149 11/23/2019 0825   TRIG 103 05/19/2020 1043   HDL 54 05/19/2020 1043   HDL 40 11/23/2019 0825   CHOLHDL 2.7 05/19/2020 1043   VLDL 39 (H) 06/03/2016 1157   LDLCALC 75 05/19/2020 1043    WEIGHTS: Wt Readings from Last 3 Encounters:  06/12/20 174 lb (78.9 kg)  05/23/20 174 lb (78.9 kg)  11/26/19 172 lb 6.4 oz (78.2 kg)    VITALS: BP 115/78   Pulse 82   Ht 5\' 3"  (1.6 m)   Wt 174 lb (78.9 kg)   SpO2 98%   BMI 30.82 kg/m   EXAM: Deferred  EKG: Deferred  ASSESSMENT: 1. Mixed dyslipidemia 2. Family history of premature stroke 3. History of elevated liver enzymes  PLAN: 1.   [name] needs to have well treated dyslipidemia despite recent dose decreased to 10 mg pravastatin.  She seems to be tolerating this better and overall her lipid profile is still very well controlled.  I would recommend continuing her current dose of medication.  Plan repeat lipids and liver enzymes in 3 months and follow-up with me in 6 months.  Pixie Casino, MD, [facility], [place] Director of the Advanced Lipid Disorders &  Cardiovascular Risk Reduction Clinic Diplomate of the American Board of Clinical Lipidology Attending Cardiologist  Direct Dial:  [phone]  Fax: [phone]  Website:  [email or web address] 06/15/2020, 10:50 AM

## 2020-06-22 ENCOUNTER — Other Ambulatory Visit (HOSPITAL_COMMUNITY): Payer: Self-pay

## 2020-06-23 ENCOUNTER — Other Ambulatory Visit (HOSPITAL_COMMUNITY): Payer: Self-pay

## 2020-06-28 ENCOUNTER — Other Ambulatory Visit (HOSPITAL_COMMUNITY): Payer: Self-pay

## 2020-06-29 ENCOUNTER — Other Ambulatory Visit (HOSPITAL_COMMUNITY): Payer: Self-pay

## 2020-07-09 DIAGNOSIS — F439 Reaction to severe stress, unspecified: Secondary | ICD-10-CM | POA: Insufficient documentation

## 2020-07-09 DIAGNOSIS — M7501 Adhesive capsulitis of right shoulder: Secondary | ICD-10-CM | POA: Insufficient documentation

## 2020-07-09 DIAGNOSIS — J309 Allergic rhinitis, unspecified: Secondary | ICD-10-CM | POA: Insufficient documentation

## 2020-07-09 DIAGNOSIS — M7502 Adhesive capsulitis of left shoulder: Secondary | ICD-10-CM | POA: Insufficient documentation

## 2020-07-09 DIAGNOSIS — Z6829 Body mass index (BMI) 29.0-29.9, adult: Secondary | ICD-10-CM | POA: Insufficient documentation

## 2020-07-09 NOTE — Patient Instructions (Signed)
Continue current medications as prescribed.  Congratulations on weight loss.  Keep up the good work.  Follow-up in 1 year or as needed.  Continue to see Dr. Debara Pickett for lipid management.

## 2020-08-15 ENCOUNTER — Other Ambulatory Visit (HOSPITAL_COMMUNITY): Payer: Self-pay

## 2020-08-17 ENCOUNTER — Other Ambulatory Visit (HOSPITAL_COMMUNITY): Payer: Self-pay

## 2020-08-25 ENCOUNTER — Other Ambulatory Visit: Payer: Self-pay | Admitting: Internal Medicine

## 2020-08-25 ENCOUNTER — Other Ambulatory Visit (HOSPITAL_COMMUNITY): Payer: Self-pay

## 2020-08-25 MED ORDER — EZETIMIBE 10 MG PO TABS
10.0000 mg | ORAL_TABLET | Freq: Every day | ORAL | 1 refills | Status: DC
  Filled 2020-08-25: qty 90, 90d supply, fill #0
  Filled 2020-11-17: qty 90, 90d supply, fill #1

## 2020-09-05 ENCOUNTER — Other Ambulatory Visit (HOSPITAL_COMMUNITY): Payer: Self-pay

## 2020-09-12 ENCOUNTER — Other Ambulatory Visit (HOSPITAL_COMMUNITY): Payer: Self-pay

## 2020-09-21 ENCOUNTER — Other Ambulatory Visit (HOSPITAL_COMMUNITY): Payer: Self-pay

## 2020-09-22 ENCOUNTER — Other Ambulatory Visit (HOSPITAL_COMMUNITY): Payer: Self-pay

## 2020-10-12 ENCOUNTER — Other Ambulatory Visit: Payer: 59 | Admitting: Internal Medicine

## 2020-10-12 ENCOUNTER — Other Ambulatory Visit: Payer: Self-pay

## 2020-10-12 DIAGNOSIS — E039 Hypothyroidism, unspecified: Secondary | ICD-10-CM

## 2020-10-13 DIAGNOSIS — H1045 Other chronic allergic conjunctivitis: Secondary | ICD-10-CM | POA: Diagnosis not present

## 2020-10-13 DIAGNOSIS — H0288A Meibomian gland dysfunction right eye, upper and lower eyelids: Secondary | ICD-10-CM | POA: Diagnosis not present

## 2020-10-13 DIAGNOSIS — H16143 Punctate keratitis, bilateral: Secondary | ICD-10-CM | POA: Diagnosis not present

## 2020-10-13 DIAGNOSIS — H0288B Meibomian gland dysfunction left eye, upper and lower eyelids: Secondary | ICD-10-CM | POA: Diagnosis not present

## 2020-10-13 DIAGNOSIS — H04123 Dry eye syndrome of bilateral lacrimal glands: Secondary | ICD-10-CM | POA: Diagnosis not present

## 2020-10-13 LAB — T4, FREE: Free T4: 1.5 ng/dL (ref 0.8–1.8)

## 2020-10-13 LAB — TSH: TSH: 0.32 mIU/L — ABNORMAL LOW

## 2020-10-15 ENCOUNTER — Telehealth: Payer: Self-pay | Admitting: Internal Medicine

## 2020-10-15 MED ORDER — LEVOTHYROXINE SODIUM 100 MCG PO TABS
100.0000 ug | ORAL_TABLET | Freq: Every day | ORAL | 0 refills | Status: DC
  Filled 2020-10-15: qty 90, 90d supply, fill #0

## 2020-10-15 NOTE — Telephone Encounter (Signed)
Have sent to pharamacy new Rx for Synthroid 0.1 mg (100 mcg) daily will need follow up in 6 weeks with free T4 and TSH

## 2020-10-17 ENCOUNTER — Other Ambulatory Visit (HOSPITAL_COMMUNITY): Payer: Self-pay

## 2020-10-18 ENCOUNTER — Other Ambulatory Visit (HOSPITAL_COMMUNITY): Payer: Self-pay

## 2020-10-18 MED ORDER — SYNTHROID 100 MCG PO TABS
100.0000 ug | ORAL_TABLET | Freq: Every day | ORAL | 0 refills | Status: DC
  Filled 2020-10-18 (×2): qty 90, 90d supply, fill #0

## 2020-10-30 DIAGNOSIS — Z6828 Body mass index (BMI) 28.0-28.9, adult: Secondary | ICD-10-CM | POA: Diagnosis not present

## 2020-10-30 DIAGNOSIS — N912 Amenorrhea, unspecified: Secondary | ICD-10-CM | POA: Diagnosis not present

## 2020-10-30 DIAGNOSIS — N951 Menopausal and female climacteric states: Secondary | ICD-10-CM | POA: Diagnosis not present

## 2020-10-30 DIAGNOSIS — Z1231 Encounter for screening mammogram for malignant neoplasm of breast: Secondary | ICD-10-CM | POA: Diagnosis not present

## 2020-10-30 DIAGNOSIS — Z13820 Encounter for screening for osteoporosis: Secondary | ICD-10-CM | POA: Diagnosis not present

## 2020-10-30 DIAGNOSIS — Z01419 Encounter for gynecological examination (general) (routine) without abnormal findings: Secondary | ICD-10-CM | POA: Diagnosis not present

## 2020-11-17 ENCOUNTER — Other Ambulatory Visit (HOSPITAL_COMMUNITY): Payer: Self-pay

## 2020-11-17 ENCOUNTER — Telehealth: Payer: 59 | Admitting: Internal Medicine

## 2020-11-17 ENCOUNTER — Other Ambulatory Visit: Payer: Self-pay

## 2020-11-17 VITALS — Temp 99.4°F

## 2020-11-17 DIAGNOSIS — U071 COVID-19: Secondary | ICD-10-CM | POA: Diagnosis not present

## 2020-11-17 MED ORDER — AZITHROMYCIN 250 MG PO TABS
ORAL_TABLET | ORAL | 0 refills | Status: AC
  Filled 2020-11-17: qty 6, 5d supply, fill #0

## 2020-11-17 MED ORDER — HYDROCODONE BIT-HOMATROP MBR 5-1.5 MG/5ML PO SOLN
5.0000 mL | Freq: Three times a day (TID) | ORAL | 0 refills | Status: DC | PRN
  Filled 2020-11-17: qty 120, 8d supply, fill #0

## 2020-11-17 NOTE — Telephone Encounter (Signed)
Scheduled video visit 

## 2020-11-27 ENCOUNTER — Other Ambulatory Visit: Payer: Self-pay

## 2020-11-27 ENCOUNTER — Other Ambulatory Visit: Payer: 59 | Admitting: Internal Medicine

## 2020-11-27 ENCOUNTER — Telehealth: Payer: Self-pay | Admitting: Internal Medicine

## 2020-11-27 DIAGNOSIS — E039 Hypothyroidism, unspecified: Secondary | ICD-10-CM | POA: Diagnosis not present

## 2020-11-27 LAB — T4, FREE: Free T4: 1.2 ng/dL (ref 0.8–1.8)

## 2020-11-27 LAB — TSH: TSH: 0.48 mIU/L

## 2020-11-27 NOTE — Telephone Encounter (Signed)
[place] results to [facility] [phone], phone [phone]  Positive Home 11/17/2020

## 2020-11-30 ENCOUNTER — Other Ambulatory Visit (HOSPITAL_COMMUNITY): Payer: Self-pay

## 2020-12-01 ENCOUNTER — Encounter: Payer: Self-pay | Admitting: Internal Medicine

## 2020-12-01 ENCOUNTER — Other Ambulatory Visit: Payer: Self-pay

## 2020-12-01 ENCOUNTER — Ambulatory Visit: Payer: 59 | Admitting: Internal Medicine

## 2020-12-01 VITALS — BP 102/76 | HR 66 | Temp 98.4°F | Wt 169.0 lb

## 2020-12-01 DIAGNOSIS — Z8669 Personal history of other diseases of the nervous system and sense organs: Secondary | ICD-10-CM | POA: Diagnosis not present

## 2020-12-01 DIAGNOSIS — E782 Mixed hyperlipidemia: Secondary | ICD-10-CM | POA: Diagnosis not present

## 2020-12-01 DIAGNOSIS — F419 Anxiety disorder, unspecified: Secondary | ICD-10-CM

## 2020-12-01 DIAGNOSIS — Z87898 Personal history of other specified conditions: Secondary | ICD-10-CM

## 2020-12-01 DIAGNOSIS — E039 Hypothyroidism, unspecified: Secondary | ICD-10-CM

## 2020-12-06 NOTE — Progress Notes (Signed)
    Subjective:    Patient ID: Natalie Kelly , female    DOB: 09-Dec-1970, 50 y.o.    MRN: [id number]   50 y.o. female presents today for : Chief Complaint  Patient presents with   Hypothyroidism    Recheck thyroid     HPI Here for follow-up on longstanding history of hypothyroidism.  Recovered from COVID-19 virus infection in early October.  Has upcoming appointment for follow-up on hyperlipidemia with Dr. Debara Pickett.  Currently on Synthroid 100 mcg daily.  TSH normal at 0.48 and free T4 is normal at 1.2.  Feels well on this dose of Synthroid.  No new complaints.         ROS no new complaints      Objective:   Vitals:   12/01/20 1559  BP: 102/76  Pulse: 66  Temp: 98.4 F (36.9 C)  SpO2: 98%     Physical Exam no thyromegaly.  No JVD.  Chest is clear to auscultation.  Cardiac exam: Regular rate and rhythm      Assessment & Plan:  Hypothyroidism-stable on Synthroid 100 mcg daily.  Return in 6 months for health maintenance exam  Mixed hyperlipidemia-to be seen again in soon by Dr. Debara Pickett.  Currently on pravastatin 10 mg daily.  Had elevated liver enzymes in the past on statin therapy and it was discontinued in 2019.  Subsequently was tried on Zetia.  Family history of stroke in father.  Hyperlipidemia was diagnosed in 2010.  History of migraine headaches treated with Topamax 50 mg daily prophylactically and Fioricet and Imitrex nasal spray as needed  Musculoskeletal pain treated with Mobic.  History of impaired glucose tolerance-mild.  Fasting glucose is 97.  Hemoglobin A1c was 6% in April.  Will be rechecked in April 2023.    Elby Showers, MD

## 2020-12-10 ENCOUNTER — Encounter: Payer: Self-pay | Admitting: Internal Medicine

## 2020-12-10 NOTE — Progress Notes (Signed)
   Subjective:    Patient ID: Natalie Kelly, female    DOB: 24-Mar-1970, 50 y.o.   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  Tested positive for COVID-19 this a.m.  Patient is seen today via interactive audio and video telecommunications due to the Coronavirus pandemic.  She is identified using 2 identifiers as Natalie Kelly, a longstanding patient in this practice.  Patient is at her home and I am at my office.  She is agreeable to visit in this format today.  Patient has a history of hyperlipidemia, mild anxiety, hypothyroidism and migraine headaches.  She takes Zetia for hyperlipidemia.  Migraines treated with as needed Fioricet and Imitrex.  She also takes Topamax as a preventative for migraines.  Social history: She is married.  Does not smoke or consume alcohol.  She works as a Tourist information centre manager at [employer].  Review of Systems see above regarding symptoms     Objective:   Physical Exam She is seen virtually and looks a bit pale.  Not having respiratory distress.  Has malaise and fatigue.  Some slight sore throat.  Nasal congestion reported.       Assessment & Plan:   Acute COVID-19 virus infection  Plan: Patient will take Zithromax Z-PAK 2 tablets day 1 followed by 1 tab days 2 through 5.  Have prescribed for cough Hycodan 1 teaspoon every 8 hours as needed for cough.  This will also work for sore throat pain.  Rest and drink plenty of fluids.  Call if symptoms worsen.  Work return to determine Health at Work at [employer]  Time spent on video visit, chart review, medical decision making and E scribing medications is 20 minutes

## 2020-12-10 NOTE — Patient Instructions (Signed)
Rest at home.  Drink plenty of fluids.  Take Zithromax Z-PAK 2 tabs day 1 followed by 1 tab days 2 through 5.  May take Hycodan 1 teaspoon every 8 hours as needed for cough or sore throat pain.  Call if symptoms worsen.  Walk some to prevent atelectasis and watch pulse oximetry.

## 2020-12-26 ENCOUNTER — Other Ambulatory Visit: Payer: Self-pay | Admitting: *Deleted

## 2020-12-26 ENCOUNTER — Encounter (HOSPITAL_BASED_OUTPATIENT_CLINIC_OR_DEPARTMENT_OTHER): Payer: Self-pay

## 2020-12-26 DIAGNOSIS — E785 Hyperlipidemia, unspecified: Secondary | ICD-10-CM

## 2021-01-01 DIAGNOSIS — E785 Hyperlipidemia, unspecified: Secondary | ICD-10-CM | POA: Diagnosis not present

## 2021-01-02 LAB — COMPREHENSIVE METABOLIC PANEL
ALT: 16 IU/L (ref 0–32)
AST: 19 IU/L (ref 0–40)
Albumin/Globulin Ratio: 1.7 (ref 1.2–2.2)
Albumin: 4.5 g/dL (ref 3.8–4.9)
Alkaline Phosphatase: 102 IU/L (ref 44–121)
BUN/Creatinine Ratio: 21 (ref 9–23)
BUN: 17 mg/dL (ref 6–24)
Bilirubin Total: 0.4 mg/dL (ref 0.0–1.2)
CO2: 22 mmol/L (ref 20–29)
Calcium: 10.1 mg/dL (ref 8.7–10.2)
Chloride: 109 mmol/L — ABNORMAL HIGH (ref 96–106)
Creatinine, Ser: 0.81 mg/dL (ref 0.57–1.00)
Globulin, Total: 2.6 g/dL (ref 1.5–4.5)
Glucose: 97 mg/dL (ref 70–99)
Potassium: 5.1 mmol/L (ref 3.5–5.2)
Sodium: 147 mmol/L — ABNORMAL HIGH (ref 134–144)
Total Protein: 7.1 g/dL (ref 6.0–8.5)
eGFR: 87 mL/min/{1.73_m2} (ref >59)

## 2021-01-02 LAB — LIPID PANEL
Chol/HDL Ratio: 3.5 ratio (ref 0.0–4.4)
Cholesterol, Total: 165 mg/dL (ref 100–199)
HDL: 47 mg/dL (ref >39)
LDL Chol Calc (NIH): 96 mg/dL (ref 0–99)
Triglycerides: 123 mg/dL (ref 0–149)
VLDL Cholesterol Cal: 22 mg/dL (ref 5–40)

## 2021-01-10 ENCOUNTER — Other Ambulatory Visit: Payer: Self-pay

## 2021-01-10 ENCOUNTER — Ambulatory Visit (INDEPENDENT_AMBULATORY_CARE_PROVIDER_SITE_OTHER): Payer: 59 | Admitting: Internal Medicine

## 2021-01-10 ENCOUNTER — Encounter (HOSPITAL_BASED_OUTPATIENT_CLINIC_OR_DEPARTMENT_OTHER): Payer: Self-pay | Admitting: Internal Medicine

## 2021-01-10 VITALS — BP 102/78 | HR 64 | Ht 63.0 in | Wt 171.0 lb

## 2021-01-10 DIAGNOSIS — Z7189 Other specified counseling: Secondary | ICD-10-CM

## 2021-01-10 DIAGNOSIS — E785 Hyperlipidemia, unspecified: Secondary | ICD-10-CM

## 2021-01-10 NOTE — Patient Instructions (Signed)
Medication Instructions:  Your physician recommends that you continue on your current medications as directed. Please refer to the Current Medication list given to you today.  *If you need a refill on your cardiac medications before your next appointment, please call your pharmacy*  Testing/Procedures: Dr. Debara Pickett has ordered a CT coronary calcium score. This test is done at [address]. This is $99 out of pocket.   Coronary CalciumScan A coronary calcium scan is an imaging test used to look for deposits of calcium and other fatty materials (plaques) in the inner lining of the blood vessels of the heart (coronary arteries). These deposits of calcium and plaques can partly clog and narrow the coronary arteries without producing any symptoms or warning signs. This puts a person at risk for a heart attack. This test can detect these deposits before symptoms develop. Tell a health care provider about: Any allergies you have. All medicines you are taking, including vitamins, herbs, eye drops, creams, and over-the-counter medicines. Any problems you or family members have had with anesthetic medicines. Any blood disorders you have. Any surgeries you have had. Any medical conditions you have. Whether you are pregnant or may be pregnant. What are the risks? Generally, this is a safe procedure. However, problems may occur, including: Harm to a pregnant woman and her unborn baby. This test involves the use of radiation. Radiation exposure can be dangerous to a pregnant woman and her unborn baby. If you are pregnant, you generally should not have this procedure done. Slight increase in the risk of cancer. This is because of the radiation involved in the test. What happens before the procedure? No preparation is needed for this procedure. What happens during the procedure? You will undress and remove any jewelry around your neck or chest. You will put on a hospital gown. Sticky  electrodes will be placed on your chest. The electrodes will be connected to an electrocardiogram (ECG) machine to record a tracing of the electrical activity of your heart. A CT scanner will take pictures of your heart. During this time, you will be asked to lie still and hold your breath for 2-3 seconds while a picture of your heart is being taken. The procedure may vary among health care providers and hospitals. What happens after the procedure? You can get dressed. You can return to your normal activities. It is up to you to get the results of your test. Ask your health care provider, or the department that is doing the test, when your results will be ready. Summary A coronary calcium scan is an imaging test used to look for deposits of calcium and other fatty materials (plaques) in the inner lining of the blood vessels of the heart (coronary arteries). Generally, this is a safe procedure. Tell your health care provider if you are pregnant or may be pregnant. No preparation is needed for this procedure. A CT scanner will take pictures of your heart. You can return to your normal activities after the scan is done. This information is not intended to replace advice given to you by your health care provider. Make sure you discuss any questions you have with your health care provider. Document Released: 07/27/2007 Document Revised: 12/18/2015 Document Reviewed: 12/18/2015 Elsevier Interactive Patient Education  2017 [place]: At [facility], you and your health needs are our priority.  As part of our continuing mission to provide you with exceptional heart care, we have created designated Provider Care  Teams.  These Care Teams include your primary Cardiologist (physician) and Advanced Practice Providers (APPs -  Physician Assistants and Nurse Practitioners) who all work together to provide you with the care you need, when you need it.  We recommend signing up for the  patient portal called "MyChart".  Sign up information is provided on this After Visit Summary.  MyChart is used to connect with patients for Virtual Visits (Telemedicine).  Patients are able to view lab/test results, encounter notes, upcoming appointments, etc.  Non-urgent messages can be sent to your provider as well.   To learn more about what you can do with MyChart, go to [email or web address].    Your next appointment:   AS NEEDED with Dr. Debara Pickett  We will contact you with your test results

## 2021-01-10 NOTE — Patient Instructions (Addendum)
Continue current medications and follow-up in April 2023 for health maintenance exam.  Continue follow-up with Dr. Debara Pickett for hyperlipidemia.  It was a pleasure to see you today.

## 2021-01-10 NOTE — Progress Notes (Signed)
LIPID CLINIC CONSULT NOTE  Chief Complaint:  Manage dyslipidemia  Primary Care Physician: Elby Showers, MD  Primary Cardiologist:  Pixie Casino, MD  HPI:  Natalie Kelly is a 50 y.o. female who is being seen today for the evaluation of dyslipidemia at the request of Baxley, Cresenciano Lick, MD.  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  In the past she had been on statin therapy however developed elevated liver enzymes for which she discontinued it in 2019.  She was on rosuvastatin 5 mg daily which was then reduced to every other day.  Her ALT was increased to 61 and then 65, subsequently returning to normal a month later after discontinuing her statin.  Ultimately then she was placed on ezetimibe.  Her most recent lipid showed total cholesterol 208, triglycerides 202, HDL 48 and LDL 126.  She was referred for further evaluation recommendations for therapy with a preference for statin for greater cardiovascular risk reduction.  She reports overall fairly healthy diet, denies any alcohol tobacco use and tries to remain active but finds difficulty in exercising regularly.  06/12/2020  Natalie Kelly returns today for follow-up of dyslipidemia.  She had recent lipids which showed marked improvement in her numbers.  Total cholesterol now 148, HDL 54, triglycerides 103 and LDL 75, down from 126 about a year ago.  Overall this represents very good treatment.  She is currently on the 10 mg pravastatin daily.  I had recently decreased the dose and she seems to be tolerating that just fine.  01/10/2021  Natalie Kelly returns today for follow-up.  She had recent lipids which show a slight increase in her cholesterol.  Total 165, HDL 47, LDL 96 and triglycerides 123.  She reports a fairly stable diet.  She had some recent dysregulation of her thyroid and has had  adjustments in her medication.  She remains on low-dose pravastatin 10 mg daily and ezetimibe.  She feels like the lower dose of pravastatin is better tolerated as she had some joint aches on the 20 mg dose.  She is concerned because a friend of hers had recent three-vessel coronary disease after hip surgery and she wonders whether or not she could have coronary disease that she was not aware of.  I think it is fairly unlikely but offered her a coronary calcium score.  PMHx:  Past Medical History:  Diagnosis Date   Allergy    seasonal allergies   Anxiety    Arthritis    toes   Depression    GERD (gastroesophageal reflux disease)    with certain foods/times of days-as needed meds   Hyperlipidemia    Migraine headache    Seborrhea    ear   Thyroid disease    hypothyroidism    Past Surgical History:  Procedure Laterality Date   CESAREAN SECTION  2001 AND 2004   ORIF PATELLA FRACTURE Right 2008   WISDOM TOOTH EXTRACTION  1990    FAMHx:  Family History  Problem Relation Age of Onset   Stroke Father    Hypertension Father    Hyperlipidemia Father    Diabetes Father    Colon polyps Father 1   Hyperlipidemia Mother    Asthma Daughter    Congestive Heart Failure Maternal Grandmother    Parkinson's disease Paternal Grandmother    Colon cancer  Neg Hx    Esophageal cancer Neg Hx    Rectal cancer Neg Hx    Stomach cancer Neg Hx     SOCHx:   reports that she has never smoked. She has never used smokeless tobacco. She reports that she does not drink alcohol and does not use drugs.  ALLERGIES:  Allergies  Allergen Reactions   Rosuvastatin Calcium Other (See Comments)    Liver enzymes elevated    ROS: Pertinent items noted in HPI and remainder of comprehensive ROS otherwise negative.  HOME MEDS: Current Outpatient Medications on File Prior to Visit  Medication Sig Dispense Refill   ALPRAZolam (XANAX) 0.25 MG tablet Take 1 tablet (0.25 mg total) by mouth 2 (two) times  daily as needed for anxiety. 20 tablet 5   Ascorbic Acid (VITAMIN C) 100 MG tablet Take 200 mg by mouth daily.     butalbital-acetaminophen-caffeine (FIORICET) 50-325-40 MG tablet One po q 4-6 hours prn migraine headaches not to exceed 6 tabs in 24 hours 30 tablet 2   cetirizine (ZYRTEC) 10 MG chewable tablet Chew 10 mg by mouth daily.     Cholecalciferol (VITAMIN D) 2000 UNITS CAPS Take by mouth. Reported on 07/20/2015     COLLAGEN PO Take 2,000 mg by mouth daily.     ELDERBERRY PO Take by mouth.      EPINEPHrine 0.3 mg/0.3 mL IJ SOAJ injection as needed.      ezetimibe (ZETIA) 10 MG tablet Take 1 tablet (10 mg total) by mouth daily. 90 tablet 1   IBUPROFEN PO Take by mouth as needed.     NEOMYCIN-POLYMYXIN-HYDROCORTISONE (CORTISPORIN) 1 % SOLN OTIC solution Place 3 drops into both ears 4 (four) times daily. 10 mL 0   pravastatin (PRAVACHOL) 10 MG tablet Take 1 tablet (10 mg total) by mouth daily. 90 tablet 3   raNITIdine HCl (ZANTAC PO) Take by mouth as needed.     SUMAtriptan (IMITREX) 20 MG/ACT nasal spray Use as directed for migraine headache 1 Inhaler 3   SYNTHROID 100 MCG tablet Take 1 tablet (100 mcg total) by mouth daily before breakfast. 90 tablet 0   topiramate (TOPAMAX) 50 MG tablet Take 1 tablet (50 mg total) by mouth daily. 90 tablet 2   TURMERIC PO Take 2,250 mg by mouth daily. Reported on 07/20/2015     No current facility-administered medications on file prior to visit.    LABS/IMAGING: No results found for this or any previous visit (from the past 48 hour(s)). No results found.  LIPID PANEL:    Component Value Date/Time   CHOL 165 01/01/2021 1125   TRIG 123 01/01/2021 1125   HDL 47 01/01/2021 1125   CHOLHDL 3.5 01/01/2021 1125   CHOLHDL 2.7 05/19/2020 1043   VLDL 39 (H) 06/03/2016 1157   LDLCALC 96 01/01/2021 1125   LDLCALC 75 05/19/2020 1043    WEIGHTS: Wt Readings from Last 3 Encounters:  01/10/21 171 lb (77.6 kg)  12/01/20 169 lb (76.7 kg)  06/12/20 174 lb  (78.9 kg)    VITALS: BP 102/78   Pulse 64   Ht 5\' 3"  (1.6 m)   Wt 171 lb (77.6 kg)   SpO2 98%   BMI 30.29 kg/m   EXAM: Deferred  EKG: Deferred  ASSESSMENT: Mixed dyslipidemia Family history of premature stroke History of elevated liver enzymes  PLAN: Natalie Kelly has had a small increase in her cholesterol, probably from dietary or activity decreases or possibly related to some recent thyroid abnormalities which  have been resolved.  Nonetheless, I think she is still at target on therapy below 100 for LDL.  We will go ahead and get a coronary calcium score.  If this is significantly abnormal she may need a lower target.  We will plan follow-up as needed however I may wish to change her medicine if she has some significant coronary calcium.  Pixie Casino, MD, [facility], [place] Director of the Advanced Lipid Disorders &  Cardiovascular Risk Reduction Clinic Diplomate of the American Board of Clinical Lipidology Attending Cardiologist  Direct Dial: [phone]  Fax: [phone]  Website:  [email or web address] 01/10/2021, 8:04 AM

## 2021-01-11 ENCOUNTER — Other Ambulatory Visit: Payer: Self-pay | Admitting: Internal Medicine

## 2021-01-11 ENCOUNTER — Other Ambulatory Visit (HOSPITAL_COMMUNITY): Payer: Self-pay

## 2021-01-11 MED ORDER — SYNTHROID 100 MCG PO TABS
100.0000 ug | ORAL_TABLET | Freq: Every day | ORAL | 0 refills | Status: DC
  Filled 2021-01-11: qty 90, 90d supply, fill #0

## 2021-01-30 DIAGNOSIS — N951 Menopausal and female climacteric states: Secondary | ICD-10-CM | POA: Diagnosis not present

## 2021-02-15 ENCOUNTER — Other Ambulatory Visit (HOSPITAL_COMMUNITY): Payer: Self-pay

## 2021-02-21 DIAGNOSIS — N951 Menopausal and female climacteric states: Secondary | ICD-10-CM | POA: Diagnosis not present

## 2021-02-22 DIAGNOSIS — M79642 Pain in left hand: Secondary | ICD-10-CM | POA: Diagnosis not present

## 2021-02-22 DIAGNOSIS — M13841 Other specified arthritis, right hand: Secondary | ICD-10-CM | POA: Diagnosis not present

## 2021-02-22 DIAGNOSIS — M79641 Pain in right hand: Secondary | ICD-10-CM | POA: Diagnosis not present

## 2021-02-22 DIAGNOSIS — R52 Pain, unspecified: Secondary | ICD-10-CM | POA: Diagnosis not present

## 2021-02-23 ENCOUNTER — Other Ambulatory Visit: Payer: Self-pay | Admitting: Internal Medicine

## 2021-02-23 ENCOUNTER — Other Ambulatory Visit (HOSPITAL_COMMUNITY): Payer: Self-pay

## 2021-02-23 MED ORDER — EZETIMIBE 10 MG PO TABS
10.0000 mg | ORAL_TABLET | Freq: Every day | ORAL | 1 refills | Status: DC
  Filled 2021-02-23: qty 90, 90d supply, fill #0
  Filled 2021-05-16: qty 90, 90d supply, fill #1

## 2021-03-06 ENCOUNTER — Other Ambulatory Visit: Payer: Self-pay | Admitting: Internal Medicine

## 2021-03-06 ENCOUNTER — Other Ambulatory Visit (HOSPITAL_COMMUNITY): Payer: Self-pay

## 2021-03-06 MED ORDER — TOPIRAMATE 50 MG PO TABS
50.0000 mg | ORAL_TABLET | Freq: Every day | ORAL | 2 refills | Status: DC
  Filled 2021-03-06: qty 90, 90d supply, fill #0
  Filled 2021-05-29: qty 90, 90d supply, fill #1
  Filled 2021-09-03: qty 90, 90d supply, fill #2

## 2021-03-07 ENCOUNTER — Ambulatory Visit (INDEPENDENT_AMBULATORY_CARE_PROVIDER_SITE_OTHER)
Admission: RE | Admit: 2021-03-07 | Discharge: 2021-03-07 | Disposition: A | Discharge status: Home / Self Care | Payer: Self-pay | Source: Ambulatory Visit | Attending: Internal Medicine | Admitting: Internal Medicine

## 2021-03-07 ENCOUNTER — Other Ambulatory Visit: Payer: Self-pay

## 2021-03-07 DIAGNOSIS — E785 Hyperlipidemia, unspecified: Secondary | ICD-10-CM

## 2021-04-09 ENCOUNTER — Other Ambulatory Visit (HOSPITAL_COMMUNITY): Payer: Self-pay

## 2021-04-09 ENCOUNTER — Other Ambulatory Visit: Payer: Self-pay | Admitting: Internal Medicine

## 2021-04-09 MED ORDER — SYNTHROID 100 MCG PO TABS
100.0000 ug | ORAL_TABLET | Freq: Every day | ORAL | 0 refills | Status: DC
  Filled 2021-04-09: qty 90, 90d supply, fill #0

## 2021-04-17 DIAGNOSIS — N951 Menopausal and female climacteric states: Secondary | ICD-10-CM | POA: Diagnosis not present

## 2021-05-16 ENCOUNTER — Other Ambulatory Visit (HOSPITAL_COMMUNITY): Payer: Self-pay

## 2021-05-22 ENCOUNTER — Other Ambulatory Visit: Payer: 59

## 2021-05-22 DIAGNOSIS — E785 Hyperlipidemia, unspecified: Secondary | ICD-10-CM

## 2021-05-22 DIAGNOSIS — F439 Reaction to severe stress, unspecified: Secondary | ICD-10-CM | POA: Diagnosis not present

## 2021-05-22 DIAGNOSIS — R7301 Impaired fasting glucose: Secondary | ICD-10-CM

## 2021-05-22 DIAGNOSIS — E039 Hypothyroidism, unspecified: Secondary | ICD-10-CM

## 2021-05-22 DIAGNOSIS — R7989 Other specified abnormal findings of blood chemistry: Secondary | ICD-10-CM

## 2021-05-25 ENCOUNTER — Other Ambulatory Visit (HOSPITAL_COMMUNITY): Payer: Self-pay

## 2021-05-25 ENCOUNTER — Ambulatory Visit (INDEPENDENT_AMBULATORY_CARE_PROVIDER_SITE_OTHER): Payer: 59 | Admitting: Internal Medicine

## 2021-05-25 ENCOUNTER — Encounter: Payer: Self-pay | Admitting: Internal Medicine

## 2021-05-25 VITALS — BP 108/82 | HR 78 | Temp 98.7°F | Ht 63.5 in | Wt 175.5 lb

## 2021-05-25 DIAGNOSIS — E782 Mixed hyperlipidemia: Secondary | ICD-10-CM | POA: Diagnosis not present

## 2021-05-25 DIAGNOSIS — E039 Hypothyroidism, unspecified: Secondary | ICD-10-CM

## 2021-05-25 DIAGNOSIS — Z8669 Personal history of other diseases of the nervous system and sense organs: Secondary | ICD-10-CM

## 2021-05-25 DIAGNOSIS — Z8639 Personal history of other endocrine, nutritional and metabolic disease: Secondary | ICD-10-CM | POA: Diagnosis not present

## 2021-05-25 DIAGNOSIS — F419 Anxiety disorder, unspecified: Secondary | ICD-10-CM | POA: Diagnosis not present

## 2021-05-25 DIAGNOSIS — Z6830 Body mass index (BMI) 30.0-30.9, adult: Secondary | ICD-10-CM | POA: Diagnosis not present

## 2021-05-25 DIAGNOSIS — R7302 Impaired glucose tolerance (oral): Secondary | ICD-10-CM

## 2021-05-25 DIAGNOSIS — Z0000 Encounter for general adult medical examination without abnormal findings: Secondary | ICD-10-CM

## 2021-05-25 DIAGNOSIS — M7918 Myalgia, other site: Secondary | ICD-10-CM | POA: Diagnosis not present

## 2021-05-25 LAB — POCT URINALYSIS DIPSTICK
Bilirubin, UA: NEGATIVE
Blood, UA: NEGATIVE
Glucose, UA: NEGATIVE
Ketones, UA: NEGATIVE
Leukocytes, UA: NEGATIVE
Nitrite, UA: NEGATIVE
Protein, UA: NEGATIVE
Spec Grav, UA: 1.025 (ref 1.010–1.025)
Urobilinogen, UA: 0.2 E.U./dL
pH, UA: 5 (ref 5.0–8.0)

## 2021-05-25 MED ORDER — EPINEPHRINE 0.3 MG/0.3ML IJ SOAJ
0.3000 mg | INTRAMUSCULAR | 1 refills | Status: AC | PRN
  Filled 2021-05-25: qty 2, 1d supply, fill #0

## 2021-05-25 MED ORDER — MELOXICAM 15 MG PO TABS
15.0000 mg | ORAL_TABLET | Freq: Every day | ORAL | 0 refills | Status: DC
  Filled 2021-05-25: qty 90, 90d supply, fill #0

## 2021-05-25 NOTE — Addendum Note (Signed)
Addended by: Angus Seller on: 05/25/2021 03:59 PM ? ? Modules accepted: Orders ? ?

## 2021-05-25 NOTE — Progress Notes (Signed)
? ?Subjective:  ? ? Patient ID: Natalie Kelly, female    DOB: 1970/08/06, 51 y.o.   MRN: [id number] ? ?HPI 51 year old Female in for health maintenance exam and evaluation of medical issues.  She has a history of hypothyroidism, hyperlipidemia and migraine headaches. ? ?She has history of hypothyroidism and is on thyroid replacement medication. ? ?History of hyperlipidemia diagnosed in 2010 and treated with Zetia initially but Dr. Debara Pickett now has her on Pravachol 10 mg daily since May 2022 and lipids are excellent.  Dr. Debara Pickett has ordered coronary calcium scoring.  I agree with this. ? ?History of mild anxiety treated as needed with Xanax 0.25 mg twice daily. ? ?She has a history of impaired glucose tolerance and hemoglobin A1c has improved from 6% in April 2022 to 5.7%. ? ?2 years ago her vitamin D level was low at 23 but it is now 52. ? ?Has had history of seborrhea in the ears treated with as needed Cortisporin otic suspension. ? ?Takes Mobic for musculoskeletal pain. ? ?Dr. Radene Knee is GYN physician. ? ?History of right patellar fracture in 2008. ? ?Colonoscopy due 2024. ? ?She had COVID-19 in the Fall 2022. ? ?Recently received Shingrix vaccines in May and August 2022. ? ?Gets annual flu vaccine through employment. ? ?Needs update on tetanus immunization and should check with [facility] employee health regarding this. ? 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  Has had injection in left shoulder. ? ?Social history: She is married.  Does not smoke or consume alcohol.  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 ? ?Family history: Father with history of stroke, hypertension and headaches.  Mother with history of headaches.  1 brother with history of headaches. ? ? ? ?Review of Systems-no new complaints.  Migraines stable. ? ?   ?Objective:  ? Physical Exam ?Blood pressure 108/82 pulse 78 temperature 98.7 pulse oximetry 98% weight 175 pounds 8 ounces BMI 30.60 ?She is  seen in the exam room in no acute distress.  She is pleasant. ? ?Skin: Warm and dry.  No cervical adenopathy.  No thyromegaly.  No carotid bruits.  Chest is clear to auscultation without rales or wheezing.  Cardiac exam: Regular rate and rhythm without murmur or ectopy.  Abdomen is soft nondistended without hepatosplenomegaly masses or tenderness.  TMs are clear.  Neck is supple.  No lower extremity pitting edema.  Brief neurological exam is intact without gross focal deficits.  Affect thought and judgment are normal. ? ? ?   ?Assessment & Plan:  ?Hyperlipidemia seen by Dr. Debara Pickett.  Coronary calcium scoring has been ordered.  She will continue with Zetia and pravastatin. ? ?History of migraine headaches-is on preventative therapy with Topamax and takes as needed Imitrex nasal spray.  Also has Fioricet if needed. ? ?Hypothyroidism treated with Synthroid 100 mcg daily.  TSH is stable ? ?Musculoskeletal pain treated with Mobic 15 mg daily.  We will need to watch kidney functions as this is an NSAID and can cause chronic kidney disease.  She feels she cannot do without it. ? ?History of vitamin D deficiency 2 years ago level was 15 and is now normal at 81. ? ?History of impaired glucose tolerance with hemoglobin A1c at 5.7%.  Treatment with metformin discussed but she would like to work on diet exercise and weight loss. ? ?BMI 30.60-she will work on diet and exercise regimen.  Needs to lose some weight.  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 ? ?Plan: Continue current medications.  Work on diet exercise and weight loss and follow-up in 1 year.  Continue follow-up with Dr. Debara Pickett regarding hyperlipidemia.  She will have annual mammogram.  She will check with employee health regarding Tdap updated.  Consider COVID booster in the Fall.  Shingrix vaccines are up-to-date.  Gets annual flu vaccine through employment.   Colonoscopy was done in 2021 and repeat study due in 2024 by Dr. Tarri Glenn. ? ?

## 2021-05-29 ENCOUNTER — Other Ambulatory Visit (HOSPITAL_COMMUNITY): Payer: Self-pay

## 2021-05-29 LAB — COMPLETE METABOLIC PANEL WITH GFR
AG Ratio: 1.5 (calc) (ref 1.0–2.5)
ALT: 20 U/L (ref 6–29)
AST: 19 U/L (ref 10–35)
Albumin: 4.5 g/dL (ref 3.6–5.1)
Alkaline phosphatase (APISO): 91 U/L (ref 37–153)
BUN: 14 mg/dL (ref 7–25)
CO2: 25 mmol/L (ref 20–32)
Calcium: 9.8 mg/dL (ref 8.6–10.4)
Chloride: 109 mmol/L (ref 98–110)
Creat: 0.74 mg/dL (ref 0.50–1.03)
Globulin: 3 g/dL (calc) (ref 1.9–3.7)
Glucose, Bld: 94 mg/dL (ref 65–99)
Potassium: 4.8 mmol/L (ref 3.5–5.3)
Sodium: 143 mmol/L (ref 135–146)
Total Bilirubin: 0.4 mg/dL (ref 0.2–1.2)
Total Protein: 7.5 g/dL (ref 6.1–8.1)
eGFR: 97 mL/min/{1.73_m2} (ref >=60)

## 2021-05-29 LAB — LIPID PANEL
Cholesterol: 171 mg/dL (ref <200)
HDL: 60 mg/dL (ref >=50)
LDL Cholesterol (Calc): 89 mg/dL (calc)
Non-HDL Cholesterol (Calc): 111 mg/dL (calc) (ref <130)
Total CHOL/HDL Ratio: 2.9 (calc) (ref <5.0)
Triglycerides: 127 mg/dL (ref <150)

## 2021-05-29 LAB — CBC WITH DIFFERENTIAL/PLATELET
Absolute Monocytes: 692 cells/uL (ref 200–950)
Basophils Absolute: 91 cells/uL (ref 0–200)
Basophils Relative: 1.2 %
Eosinophils Absolute: 228 cells/uL (ref 15–500)
Eosinophils Relative: 3 %
HCT: 44.8 % (ref 35.0–45.0)
Hemoglobin: 14.7 g/dL (ref 11.7–15.5)
Lymphs Abs: 3002 cells/uL (ref 850–3900)
MCH: 28.3 pg (ref 27.0–33.0)
MCHC: 32.8 g/dL (ref 32.0–36.0)
MCV: 86.3 fL (ref 80.0–100.0)
MPV: 10.1 fL (ref 7.5–12.5)
Monocytes Relative: 9.1 %
Neutro Abs: 3587 cells/uL (ref 1500–7800)
Neutrophils Relative %: 47.2 %
Platelets: 381 10*3/uL (ref 140–400)
RBC: 5.19 10*6/uL — ABNORMAL HIGH (ref 3.80–5.10)
RDW: 12.7 % (ref 11.0–15.0)
Total Lymphocyte: 39.5 %
WBC: 7.6 10*3/uL (ref 3.8–10.8)

## 2021-05-29 LAB — VITAMIN D 25 HYDROXY (VIT D DEFICIENCY, FRACTURES): Vit D, 25-Hydroxy: 39 ng/mL (ref 30–100)

## 2021-05-29 LAB — HEMOGLOBIN A1C W/OUT EAG: Hgb A1c MFr Bld: 5.7 % of total Hgb — ABNORMAL HIGH (ref <5.7)

## 2021-05-29 LAB — TSH: TSH: 0.92 mIU/L

## 2021-06-10 NOTE — Patient Instructions (Addendum)
It was a pleasure to see you today.  Please work on diet exercise and weight loss.  Continue current medications.  Continue follow-up with Dr. Debara Pickett.  Consider COVID booster in the fall.  Please check with employee health regarding tetanus update.  Colonoscopy due in 2024 by Dr. Tarri Glenn.  Follow-up in 1 year or as needed. ?

## 2021-06-19 DIAGNOSIS — N951 Menopausal and female climacteric states: Secondary | ICD-10-CM | POA: Diagnosis not present

## 2021-08-09 ENCOUNTER — Other Ambulatory Visit: Payer: Self-pay | Admitting: Internal Medicine

## 2021-08-09 ENCOUNTER — Other Ambulatory Visit (HOSPITAL_COMMUNITY): Payer: Self-pay

## 2021-08-09 MED ORDER — EZETIMIBE 10 MG PO TABS
10.0000 mg | ORAL_TABLET | Freq: Every day | ORAL | 1 refills | Status: DC
  Filled 2021-08-09: qty 90, 90d supply, fill #0
  Filled 2021-11-14: qty 90, 90d supply, fill #1

## 2021-08-10 ENCOUNTER — Other Ambulatory Visit (HOSPITAL_COMMUNITY): Payer: Self-pay

## 2021-08-10 MED ORDER — PRAVASTATIN SODIUM 10 MG PO TABS
10.0000 mg | ORAL_TABLET | Freq: Every day | ORAL | 1 refills | Status: DC
  Filled 2021-08-10: qty 90, 90d supply, fill #0
  Filled 2021-11-14: qty 90, 90d supply, fill #1

## 2021-09-03 ENCOUNTER — Other Ambulatory Visit: Payer: Self-pay | Admitting: Internal Medicine

## 2021-09-03 ENCOUNTER — Other Ambulatory Visit (HOSPITAL_COMMUNITY): Payer: Self-pay

## 2021-09-03 MED ORDER — SYNTHROID 100 MCG PO TABS
100.0000 ug | ORAL_TABLET | Freq: Every day | ORAL | 0 refills | Status: DC
  Filled 2021-09-03: qty 90, 90d supply, fill #0

## 2021-11-06 DIAGNOSIS — Z1231 Encounter for screening mammogram for malignant neoplasm of breast: Secondary | ICD-10-CM | POA: Diagnosis not present

## 2021-11-06 DIAGNOSIS — Z6830 Body mass index (BMI) 30.0-30.9, adult: Secondary | ICD-10-CM | POA: Diagnosis not present

## 2021-11-06 DIAGNOSIS — Z01419 Encounter for gynecological examination (general) (routine) without abnormal findings: Secondary | ICD-10-CM | POA: Diagnosis not present

## 2021-11-06 DIAGNOSIS — Z1151 Encounter for screening for human papillomavirus (HPV): Secondary | ICD-10-CM | POA: Diagnosis not present

## 2021-11-06 DIAGNOSIS — Z124 Encounter for screening for malignant neoplasm of cervix: Secondary | ICD-10-CM | POA: Diagnosis not present

## 2021-11-14 ENCOUNTER — Other Ambulatory Visit (HOSPITAL_COMMUNITY): Payer: Self-pay

## 2021-12-03 ENCOUNTER — Other Ambulatory Visit (HOSPITAL_COMMUNITY): Payer: Self-pay

## 2021-12-03 ENCOUNTER — Other Ambulatory Visit: Payer: Self-pay | Admitting: Internal Medicine

## 2021-12-03 MED ORDER — SYNTHROID 100 MCG PO TABS
100.0000 ug | ORAL_TABLET | Freq: Every day | ORAL | 0 refills | Status: DC
  Filled 2021-12-03: qty 90, 90d supply, fill #0

## 2021-12-03 MED ORDER — TOPIRAMATE 50 MG PO TABS
50.0000 mg | ORAL_TABLET | Freq: Every day | ORAL | 2 refills | Status: DC
  Filled 2021-12-03: qty 90, 90d supply, fill #0
  Filled 2022-02-27: qty 90, 90d supply, fill #1
  Filled 2022-05-30 (×2): qty 90, 90d supply, fill #2

## 2022-02-13 ENCOUNTER — Other Ambulatory Visit: Payer: Self-pay | Admitting: Internal Medicine

## 2022-02-13 ENCOUNTER — Other Ambulatory Visit (HOSPITAL_COMMUNITY): Payer: Self-pay

## 2022-02-13 MED ORDER — EZETIMIBE 10 MG PO TABS
10.0000 mg | ORAL_TABLET | Freq: Every day | ORAL | 1 refills | Status: DC
  Filled 2022-02-13: qty 90, 90d supply, fill #0
  Filled 2022-05-13: qty 90, 90d supply, fill #1

## 2022-02-13 MED ORDER — NEOMYCIN-POLYMYXIN-HC 1 % OT SOLN
3.0000 [drp] | Freq: Four times a day (QID) | OTIC | 1 refills | Status: DC
  Filled 2022-02-13: qty 10, 8d supply, fill #0

## 2022-02-13 MED ORDER — PRAVASTATIN SODIUM 10 MG PO TABS
10.0000 mg | ORAL_TABLET | Freq: Every day | ORAL | 3 refills | Status: DC
  Filled 2022-02-13: qty 90, 90d supply, fill #0
  Filled 2022-05-13: qty 90, 90d supply, fill #1
  Filled 2022-08-26: qty 90, 90d supply, fill #2
  Filled 2022-11-25: qty 90, 90d supply, fill #3

## 2022-02-27 ENCOUNTER — Telehealth: Payer: Self-pay | Admitting: Internal Medicine

## 2022-03-01 ENCOUNTER — Other Ambulatory Visit (HOSPITAL_COMMUNITY): Payer: Self-pay

## 2022-03-01 NOTE — Telephone Encounter (Signed)
Patient called and scheduled CPE

## 2022-03-05 ENCOUNTER — Other Ambulatory Visit (HOSPITAL_COMMUNITY): Payer: Self-pay

## 2022-03-05 ENCOUNTER — Other Ambulatory Visit: Payer: Self-pay | Admitting: Internal Medicine

## 2022-03-05 MED ORDER — SYNTHROID 100 MCG PO TABS
100.0000 ug | ORAL_TABLET | Freq: Every day | ORAL | 0 refills | Status: DC
  Filled 2022-03-05: qty 90, 90d supply, fill #0

## 2022-03-06 ENCOUNTER — Other Ambulatory Visit (HOSPITAL_COMMUNITY): Payer: Self-pay

## 2022-03-07 ENCOUNTER — Other Ambulatory Visit (HOSPITAL_COMMUNITY): Payer: Self-pay

## 2022-04-17 DIAGNOSIS — F411 Generalized anxiety disorder: Secondary | ICD-10-CM | POA: Diagnosis not present

## 2022-04-25 DIAGNOSIS — F411 Generalized anxiety disorder: Secondary | ICD-10-CM | POA: Diagnosis not present

## 2022-05-13 ENCOUNTER — Other Ambulatory Visit: Payer: Self-pay

## 2022-05-13 ENCOUNTER — Other Ambulatory Visit (HOSPITAL_COMMUNITY): Payer: Self-pay

## 2022-05-14 DIAGNOSIS — F411 Generalized anxiety disorder: Secondary | ICD-10-CM | POA: Diagnosis not present

## 2022-05-16 DIAGNOSIS — F411 Generalized anxiety disorder: Secondary | ICD-10-CM | POA: Diagnosis not present

## 2022-05-23 DIAGNOSIS — F411 Generalized anxiety disorder: Secondary | ICD-10-CM | POA: Diagnosis not present

## 2022-05-28 DIAGNOSIS — F411 Generalized anxiety disorder: Secondary | ICD-10-CM | POA: Diagnosis not present

## 2022-05-30 ENCOUNTER — Other Ambulatory Visit: Payer: Self-pay | Admitting: Internal Medicine

## 2022-05-30 ENCOUNTER — Other Ambulatory Visit (HOSPITAL_COMMUNITY): Payer: Self-pay

## 2022-05-30 ENCOUNTER — Other Ambulatory Visit: Payer: Self-pay

## 2022-05-30 MED ORDER — SYNTHROID 100 MCG PO TABS
100.0000 ug | ORAL_TABLET | Freq: Every day | ORAL | 0 refills | Status: DC
  Filled 2022-05-30: qty 90, 90d supply, fill #0

## 2022-05-31 ENCOUNTER — Other Ambulatory Visit (HOSPITAL_COMMUNITY): Payer: Self-pay

## 2022-06-05 DIAGNOSIS — F411 Generalized anxiety disorder: Secondary | ICD-10-CM | POA: Diagnosis not present

## 2022-06-10 NOTE — Progress Notes (Signed)
Patient Care Team: Margaree Mackintosh, MD as PCP - General (Internal Medicine) Rennis Golden Lisette Abu, MD as PCP - Cardiology (Cardiology) Linna Darner, RD as Dietitian (Family Medicine)  Visit Date: 06/21/22  Subjective:    Patient ID: Natalie Kelly , Female   DOB: 1970/03/21, 52 y.o.    MRN: [id number]   52 y.o. Female presents today for annual comprehensive physical exam. Patient has a past medical history of allergies, anxiety and depression, arthritis toes, GERD, hyperlipidemia, migraine headaches, seborrhea ear, hypothyroidism.  History of hypothyroidism treated with levothyroxine 100 mcg daily before breakfast. TSH at 1.63.   History of musculoskeletal pain treated with meloxicam 15 mg daily.  History of migraine headaches treated with Fioricet 50-325-40 mg every 4-6 hours, sumatriptan nasal spray.  History of hyperlipidemia treated with ezetimibe 10 mg daily. TRIG elevated at 194- lipids otherwise normal. HGBA1c at 6.4%, up from 5.7% one year ago.  History of mild anxiety treated as needed with Xanax 0.25 mg twice daily.   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  Has had injection in left shoulder.  Glucose normal. Kidney, liver function normal. Electrolytes normal. Blood proteins normal. CBC normal.  Colonoscopy last completed 11/02/19. Results showed diverticulosis in sigmoid, descending colon, one 3 mm polyp in descending colon, one 3 mm polyp in ascending colon, one 3 mm polyp at hepatic flexure. Examination otherwise normal. Pathology showed three sessile serrated polyps without cytologic dysplasia. Recommended repeat in 2024.  Social history: She is married.  Does not smoke or consume alcohol.  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  Family history: Father with history of stroke, hypertension and headaches.  Mother with history of headaches.  1 brother with history of headaches.   Past Medical History:  Diagnosis Date    Allergy    seasonal allergies   Anxiety    Arthritis    toes   Depression    GERD (gastroesophageal reflux disease)    with certain foods/times of days-as needed meds   Hyperlipidemia    Migraine headache    Seborrhea    ear   Thyroid disease    hypothyroidism     Family History  Problem Relation Age of Onset   Stroke Father    Hypertension Father    Hyperlipidemia Father    Diabetes Father    Colon polyps Father 29   Hyperlipidemia Mother    Asthma Daughter    Congestive Heart Failure Maternal Grandmother    Parkinson's disease Paternal Grandmother    Colon cancer Neg Hx    Esophageal cancer Neg Hx    Rectal cancer Neg Hx    Stomach cancer Neg Hx     Social History   Social History Narrative   Not on file      Review of Systems  Constitutional:  Negative for chills, fever, malaise/fatigue and weight loss.  HENT:  Negative for hearing loss, sinus pain and sore throat.   Respiratory:  Negative for cough, hemoptysis and shortness of breath.   Cardiovascular:  Negative for chest pain, palpitations, leg swelling and PND.  Gastrointestinal:  Negative for abdominal pain, constipation, diarrhea, heartburn, nausea and vomiting.  Genitourinary:  Negative for dysuria, frequency and urgency.  Musculoskeletal:  Negative for back pain, myalgias and neck pain.  Skin:  Negative for itching and rash.  Neurological:  Negative for dizziness, tingling, seizures and headaches.  Endo/Heme/Allergies:  Negative for polydipsia.  Psychiatric/Behavioral:  Negative for depression. The patient is not nervous/anxious.         Objective:   Vitals: BP 112/80   Pulse 87   Temp 97.8 F (36.6 C) (Tympanic)   Ht 5' 3.5" (1.613 m)   Wt 181 lb (82.1 kg)   SpO2 98%   BMI 31.56 kg/m    Physical Exam Vitals and nursing note reviewed.  Constitutional:      General: She is not in acute distress.    Appearance: Normal appearance. She is not ill-appearing or toxic-appearing.  HENT:      Head: Normocephalic and atraumatic.     Right Ear: Hearing, tympanic membrane, ear canal and external ear normal.     Left Ear: Hearing, tympanic membrane, ear canal and external ear normal.     Mouth/Throat:     Pharynx: Oropharynx is clear.  Eyes:     Extraocular Movements: Extraocular movements intact.     Pupils: Pupils are equal, round, and reactive to light.  Neck:     Thyroid: No thyroid mass, thyromegaly or thyroid tenderness.     Vascular: No carotid bruit.  Cardiovascular:     Rate and Rhythm: Normal rate and regular rhythm. No extrasystoles are present.    Pulses:          Dorsalis pedis pulses are 1+ on the right side and 1+ on the left side.     Heart sounds: Normal heart sounds. No murmur heard.    No friction rub. No gallop.  Pulmonary:     Effort: Pulmonary effort is normal.     Breath sounds: Normal breath sounds. No decreased breath sounds, wheezing, rhonchi or rales.  Chest:     Chest wall: No mass.  Abdominal:     Palpations: Abdomen is soft. There is no hepatomegaly, splenomegaly or mass.     Tenderness: There is no abdominal tenderness.     Hernia: No hernia is present.  Musculoskeletal:     Cervical back: Normal range of motion.     Right lower leg: No edema.     Left lower leg: No edema.  Lymphadenopathy:     Cervical: No cervical adenopathy.     Upper Body:     Right upper body: No supraclavicular adenopathy.     Left upper body: No supraclavicular adenopathy.  Skin:    General: Skin is warm and dry.  Neurological:     General: No focal deficit present.     Mental Status: She is alert and oriented to person, place, and time. Mental status is at baseline.     Sensory: Sensation is intact.     Motor: Motor function is intact. No weakness.     Deep Tendon Reflexes: Reflexes are normal and symmetric.  Psychiatric:        Attention and Perception: Attention normal.        Mood and Affect: Mood normal.        Speech: Speech normal.         Behavior: Behavior normal.        Thought Content: Thought content normal.        Cognition and Memory: Cognition normal.        Judgment: Judgment normal.       Results:   Studies obtained and personally reviewed by me:  Colonoscopy last completed 11/02/19. Results showed diverticulosis in sigmoid, descending colon, one 3 mm polyp in descending colon, one 3 mm polyp in ascending colon, one 3 mm polyp  at hepatic flexure. Examination otherwise normal. Pathology showed three sessile serrated polyps without cytologic dysplasia. Recommended repeat in 2024.   Labs:       Component Value Date/Time   NA 142 06/20/2022 1144   NA 147 (H) 01/01/2021 1126   K 4.8 06/20/2022 1144   CL 107 06/20/2022 1144   CO2 25 06/20/2022 1144   GLUCOSE 92 06/20/2022 1144   BUN 16 06/20/2022 1144   BUN 17 01/01/2021 1126   CREATININE 0.74 06/20/2022 1144   CALCIUM 10.1 06/20/2022 1144   PROT 7.2 06/20/2022 1144   PROT 7.1 01/01/2021 1126   ALBUMIN 4.5 01/01/2021 1126   AST 20 06/20/2022 1144   ALT 26 06/20/2022 1144   ALKPHOS 102 01/01/2021 1126   BILITOT 0.5 06/20/2022 1144   BILITOT 0.4 01/01/2021 1126   GFRNONAA 91 05/19/2020 1043   GFRAA 105 05/19/2020 1043     Lab Results  Component Value Date   WBC 8.1 06/20/2022   HGB 14.0 06/20/2022   HCT 41.9 06/20/2022   MCV 83.6 06/20/2022   PLT 345 06/20/2022    Lab Results  Component Value Date   CHOL 173 06/20/2022   HDL 50 06/20/2022   LDLCALC 94 06/20/2022   TRIG 194 (H) 06/20/2022   CHOLHDL 3.5 06/20/2022    Lab Results  Component Value Date   HGBA1C 6.4 (H) 06/20/2022     Lab Results  Component Value Date   TSH 1.63 06/20/2022      Assessment & Plan:   Hypothyroidism: treated with levothyroxine 100 mcg daily before breakfast. TSH at 1.63.   Musculoskeletal pain: treated with meloxicam 15 mg daily.  Migraine headaches: treated with Fioricet 50-325-40 mg every 4-6 hours, sumatriptan nasal spray.  Hyperlipidemia:  treated with ezetimibe 10 mg daily. TRIG elevated at 194- lipids otherwise normal. HGBA1c at 6.4%, up from 5.7% one year ago. Will refer to [facility] for consideration of weight loss drugs and thyroid management. Has been counseled on healthy diet and exercise.  Anxiety: treated as needed with Xanax 0.25 mg twice daily.   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 Has had injection in left shoulder.  Colonoscopy last completed 11/02/19. Results showed diverticulosis in sigmoid, descending colon, one 3 mm polyp in descending colon, one 3 mm polyp in ascending colon, one 3 mm polyp at hepatic flexure. Examination otherwise normal. Pathology showed three sessile serrated polyps without cytologic dysplasia. Referred for repeat screening.  Pelvic and breast exams deferred to gynecologist. She is followed by Dr. Arelia Sneddon at [facility].  Vaccine counseling: will go to Employee Health for tetanus vaccine. UTD on flu, shingles vaccine.    I,Alexander Ruley,acting as a Neurosurgeon for Margaree Mackintosh, MD.,have documented all relevant documentation on the behalf of Margaree Mackintosh, MD,as directed by  Margaree Mackintosh, MD while in the presence of Margaree Mackintosh, MD.   I, Margaree Mackintosh, MD, have reviewed all documentation for this visit. The documentation on 07/28/22 for the exam, diagnosis, procedures, and orders are all accurate and complete.

## 2022-06-19 DIAGNOSIS — F411 Generalized anxiety disorder: Secondary | ICD-10-CM | POA: Diagnosis not present

## 2022-06-20 ENCOUNTER — Other Ambulatory Visit: Payer: 59

## 2022-06-20 DIAGNOSIS — E039 Hypothyroidism, unspecified: Secondary | ICD-10-CM | POA: Diagnosis not present

## 2022-06-20 DIAGNOSIS — R7302 Impaired glucose tolerance (oral): Secondary | ICD-10-CM

## 2022-06-20 DIAGNOSIS — F439 Reaction to severe stress, unspecified: Secondary | ICD-10-CM

## 2022-06-20 DIAGNOSIS — E782 Mixed hyperlipidemia: Secondary | ICD-10-CM | POA: Diagnosis not present

## 2022-06-20 LAB — CBC WITH DIFFERENTIAL/PLATELET
Eosinophils Relative: 2.6 %
MCH: 27.9 pg (ref 27.0–33.0)
MCV: 83.6 fL (ref 80.0–100.0)
Monocytes Relative: 8.7 %
Neutro Abs: 4026 cells/uL (ref 1500–7800)
Neutrophils Relative %: 49.7 %
Platelets: 345 10*3/uL (ref 140–400)
RBC: 5.01 10*6/uL (ref 3.80–5.10)
RDW: 12.4 % (ref 11.0–15.0)
Total Lymphocyte: 38.3 %
WBC: 8.1 10*3/uL (ref 3.8–10.8)

## 2022-06-21 ENCOUNTER — Encounter: Payer: Self-pay | Admitting: Internal Medicine

## 2022-06-21 ENCOUNTER — Ambulatory Visit: Payer: 59 | Admitting: Internal Medicine

## 2022-06-21 VITALS — BP 112/80 | HR 87 | Temp 97.8°F | Ht 63.5 in | Wt 181.0 lb

## 2022-06-21 DIAGNOSIS — M7918 Myalgia, other site: Secondary | ICD-10-CM | POA: Diagnosis not present

## 2022-06-21 DIAGNOSIS — F419 Anxiety disorder, unspecified: Secondary | ICD-10-CM | POA: Diagnosis not present

## 2022-06-21 DIAGNOSIS — E782 Mixed hyperlipidemia: Secondary | ICD-10-CM

## 2022-06-21 DIAGNOSIS — Z8639 Personal history of other endocrine, nutritional and metabolic disease: Secondary | ICD-10-CM | POA: Diagnosis not present

## 2022-06-21 DIAGNOSIS — Z8669 Personal history of other diseases of the nervous system and sense organs: Secondary | ICD-10-CM

## 2022-06-21 DIAGNOSIS — Z0000 Encounter for general adult medical examination without abnormal findings: Secondary | ICD-10-CM | POA: Diagnosis not present

## 2022-06-21 DIAGNOSIS — R7302 Impaired glucose tolerance (oral): Secondary | ICD-10-CM | POA: Diagnosis not present

## 2022-06-21 DIAGNOSIS — E039 Hypothyroidism, unspecified: Secondary | ICD-10-CM | POA: Diagnosis not present

## 2022-06-21 DIAGNOSIS — Z6831 Body mass index (BMI) 31.0-31.9, adult: Secondary | ICD-10-CM | POA: Diagnosis not present

## 2022-06-21 LAB — COMPLETE METABOLIC PANEL WITH GFR
AG Ratio: 1.5 (calc) (ref 1.0–2.5)
ALT: 26 U/L (ref 6–29)
AST: 20 U/L (ref 10–35)
Albumin: 4.3 g/dL (ref 3.6–5.1)
Alkaline phosphatase (APISO): 89 U/L (ref 37–153)
BUN: 16 mg/dL (ref 7–25)
CO2: 25 mmol/L (ref 20–32)
Calcium: 10.1 mg/dL (ref 8.6–10.4)
Chloride: 107 mmol/L (ref 98–110)
Creat: 0.74 mg/dL (ref 0.50–1.03)
Globulin: 2.9 g/dL (calc) (ref 1.9–3.7)
Glucose, Bld: 92 mg/dL (ref 65–99)
Potassium: 4.8 mmol/L (ref 3.5–5.3)
Sodium: 142 mmol/L (ref 135–146)
Total Bilirubin: 0.5 mg/dL (ref 0.2–1.2)
Total Protein: 7.2 g/dL (ref 6.1–8.1)
eGFR: 97 mL/min/{1.73_m2} (ref >=60)

## 2022-06-21 LAB — POCT URINALYSIS DIPSTICK
Bilirubin, UA: NEGATIVE
Glucose, UA: NEGATIVE
Ketones, UA: NEGATIVE
Leukocytes, UA: NEGATIVE
Nitrite, UA: NEGATIVE
Protein, UA: NEGATIVE
Spec Grav, UA: >=1.03 (ref 1.010–1.025)
Urobilinogen, UA: 0.2 E.U./dL
pH, UA: 5 (ref 5.0–8.0)

## 2022-06-21 LAB — CBC WITH DIFFERENTIAL/PLATELET
Absolute Monocytes: 705 cells/uL (ref 200–950)
Basophils Absolute: 57 cells/uL (ref 0–200)
Basophils Relative: 0.7 %
Eosinophils Absolute: 211 cells/uL (ref 15–500)
HCT: 41.9 % (ref 35.0–45.0)
Hemoglobin: 14 g/dL (ref 11.7–15.5)
Lymphs Abs: 3102 cells/uL (ref 850–3900)
MCHC: 33.4 g/dL (ref 32.0–36.0)
MPV: 10.1 fL (ref 7.5–12.5)

## 2022-06-21 LAB — HEMOGLOBIN A1C
Hgb A1c MFr Bld: 6.4 % of total Hgb — ABNORMAL HIGH (ref <5.7)
Mean Plasma Glucose: 137 mg/dL
eAG (mmol/L): 7.6 mmol/L

## 2022-06-21 LAB — TSH: TSH: 1.63 mIU/L

## 2022-06-21 LAB — LIPID PANEL
Cholesterol: 173 mg/dL (ref <200)
HDL: 50 mg/dL (ref >=50)
LDL Cholesterol (Calc): 94 mg/dL (calc)
Non-HDL Cholesterol (Calc): 123 mg/dL (calc) (ref <130)
Total CHOL/HDL Ratio: 3.5 (calc) (ref <5.0)
Triglycerides: 194 mg/dL — ABNORMAL HIGH (ref <150)

## 2022-06-21 LAB — MICROALBUMIN / CREATININE URINE RATIO
Creatinine, Urine: 38 mg/dL (ref 20–275)
Microalb Creat Ratio: 5 mg/g creat (ref <30)
Microalb, Ur: 0.2 mg/dL

## 2022-06-25 DIAGNOSIS — F411 Generalized anxiety disorder: Secondary | ICD-10-CM | POA: Diagnosis not present

## 2022-07-16 ENCOUNTER — Telehealth: Payer: Self-pay | Admitting: Internal Medicine

## 2022-07-16 NOTE — Telephone Encounter (Signed)
Put in chart

## 2022-07-16 NOTE — Telephone Encounter (Signed)
Andre called to say she got her TDAP from [facility] on 01/10/2016

## 2022-07-24 DIAGNOSIS — F411 Generalized anxiety disorder: Secondary | ICD-10-CM | POA: Diagnosis not present

## 2022-07-28 NOTE — Patient Instructions (Addendum)
Patient has had considerable stress at work. Continue to work on diet and exercise efforts. No change in meds. May consider [facility]. RTC in one year or as needed.

## 2022-07-29 DIAGNOSIS — F411 Generalized anxiety disorder: Secondary | ICD-10-CM | POA: Diagnosis not present

## 2022-08-08 DIAGNOSIS — F411 Generalized anxiety disorder: Secondary | ICD-10-CM | POA: Diagnosis not present

## 2022-08-26 ENCOUNTER — Other Ambulatory Visit: Payer: Self-pay

## 2022-08-26 ENCOUNTER — Other Ambulatory Visit: Payer: Self-pay | Admitting: Internal Medicine

## 2022-08-26 ENCOUNTER — Other Ambulatory Visit (HOSPITAL_COMMUNITY): Payer: Self-pay

## 2022-08-26 MED ORDER — EZETIMIBE 10 MG PO TABS
10.0000 mg | ORAL_TABLET | Freq: Every day | ORAL | 1 refills | Status: DC
  Filled 2022-08-26: qty 90, 90d supply, fill #0
  Filled 2022-11-25: qty 90, 90d supply, fill #1

## 2022-08-26 MED ORDER — SYNTHROID 100 MCG PO TABS
100.0000 ug | ORAL_TABLET | Freq: Every day | ORAL | 0 refills | Status: DC
  Filled 2022-08-26: qty 90, 90d supply, fill #0

## 2022-08-26 MED ORDER — TOPIRAMATE 50 MG PO TABS
50.0000 mg | ORAL_TABLET | Freq: Every day | ORAL | 2 refills | Status: DC
  Filled 2022-08-26: qty 90, 90d supply, fill #0
  Filled 2022-11-25: qty 90, 90d supply, fill #1
  Filled 2023-02-19: qty 90, 90d supply, fill #2

## 2022-10-09 DIAGNOSIS — Z8616 Personal history of COVID-19: Secondary | ICD-10-CM | POA: Diagnosis not present

## 2022-10-09 DIAGNOSIS — R7303 Prediabetes: Secondary | ICD-10-CM | POA: Diagnosis not present

## 2022-10-09 DIAGNOSIS — E669 Obesity, unspecified: Secondary | ICD-10-CM | POA: Diagnosis not present

## 2022-10-09 DIAGNOSIS — E782 Mixed hyperlipidemia: Secondary | ICD-10-CM | POA: Diagnosis not present

## 2022-10-09 DIAGNOSIS — E039 Hypothyroidism, unspecified: Secondary | ICD-10-CM | POA: Diagnosis not present

## 2022-10-17 ENCOUNTER — Encounter: Payer: Self-pay | Admitting: Gastroenterology

## 2022-11-08 ENCOUNTER — Other Ambulatory Visit (HOSPITAL_COMMUNITY): Payer: Self-pay

## 2022-11-21 DIAGNOSIS — Z01419 Encounter for gynecological examination (general) (routine) without abnormal findings: Secondary | ICD-10-CM | POA: Diagnosis not present

## 2022-11-21 DIAGNOSIS — Z124 Encounter for screening for malignant neoplasm of cervix: Secondary | ICD-10-CM | POA: Diagnosis not present

## 2022-11-21 DIAGNOSIS — Z1151 Encounter for screening for human papillomavirus (HPV): Secondary | ICD-10-CM | POA: Diagnosis not present

## 2022-11-21 DIAGNOSIS — Z6830 Body mass index (BMI) 30.0-30.9, adult: Secondary | ICD-10-CM | POA: Diagnosis not present

## 2022-11-21 DIAGNOSIS — Z1231 Encounter for screening mammogram for malignant neoplasm of breast: Secondary | ICD-10-CM | POA: Diagnosis not present

## 2022-11-21 LAB — HM PAP SMEAR

## 2022-11-21 LAB — HM MAMMOGRAPHY

## 2022-11-22 ENCOUNTER — Other Ambulatory Visit (HOSPITAL_COMMUNITY): Payer: Self-pay

## 2022-11-25 ENCOUNTER — Ambulatory Visit: Payer: 59

## 2022-11-25 ENCOUNTER — Other Ambulatory Visit: Payer: Self-pay

## 2022-11-25 ENCOUNTER — Other Ambulatory Visit: Payer: Self-pay | Admitting: Internal Medicine

## 2022-11-25 ENCOUNTER — Other Ambulatory Visit (HOSPITAL_COMMUNITY): Payer: Self-pay

## 2022-11-25 VITALS — Ht 64.0 in | Wt 178.0 lb

## 2022-11-25 DIAGNOSIS — Z860100 Personal history of colon polyps, unspecified: Secondary | ICD-10-CM

## 2022-11-25 MED ORDER — SYNTHROID 100 MCG PO TABS
100.0000 ug | ORAL_TABLET | Freq: Every day | ORAL | 1 refills | Status: DC
  Filled 2022-11-25: qty 90, 90d supply, fill #0
  Filled 2023-02-19: qty 90, 90d supply, fill #1

## 2022-11-25 MED ORDER — NA SULFATE-K SULFATE-MG SULF 17.5-3.13-1.6 GM/177ML PO SOLN
1.0000 | Freq: Once | ORAL | 0 refills | Status: AC
  Filled 2022-11-25: qty 354, 2d supply, fill #0

## 2022-11-25 NOTE — Progress Notes (Signed)

## 2022-12-03 ENCOUNTER — Encounter: Payer: Self-pay | Admitting: Gastroenterology

## 2022-12-16 ENCOUNTER — Encounter: Payer: Self-pay | Admitting: Gastroenterology

## 2022-12-16 ENCOUNTER — Ambulatory Visit: Payer: 59 | Admitting: Gastroenterology

## 2022-12-16 VITALS — BP 116/76 | HR 61 | Temp 97.9°F | Resp 14 | Ht 64.0 in | Wt 178.0 lb

## 2022-12-16 DIAGNOSIS — Z09 Encounter for follow-up examination after completed treatment for conditions other than malignant neoplasm: Secondary | ICD-10-CM | POA: Diagnosis not present

## 2022-12-16 DIAGNOSIS — D120 Benign neoplasm of cecum: Secondary | ICD-10-CM

## 2022-12-16 DIAGNOSIS — E039 Hypothyroidism, unspecified: Secondary | ICD-10-CM | POA: Diagnosis not present

## 2022-12-16 DIAGNOSIS — D123 Benign neoplasm of transverse colon: Secondary | ICD-10-CM | POA: Diagnosis not present

## 2022-12-16 DIAGNOSIS — Z860100 Personal history of colon polyps, unspecified: Secondary | ICD-10-CM

## 2022-12-16 DIAGNOSIS — Z860101 Personal history of adenomatous and serrated colon polyps: Secondary | ICD-10-CM

## 2022-12-16 DIAGNOSIS — F32A Depression, unspecified: Secondary | ICD-10-CM | POA: Diagnosis not present

## 2022-12-16 DIAGNOSIS — Z1211 Encounter for screening for malignant neoplasm of colon: Secondary | ICD-10-CM | POA: Diagnosis not present

## 2022-12-16 DIAGNOSIS — D122 Benign neoplasm of ascending colon: Secondary | ICD-10-CM | POA: Diagnosis not present

## 2022-12-16 DIAGNOSIS — K635 Polyp of colon: Secondary | ICD-10-CM | POA: Diagnosis not present

## 2022-12-16 DIAGNOSIS — F419 Anxiety disorder, unspecified: Secondary | ICD-10-CM | POA: Diagnosis not present

## 2022-12-16 DIAGNOSIS — E785 Hyperlipidemia, unspecified: Secondary | ICD-10-CM | POA: Diagnosis not present

## 2022-12-16 MED ORDER — SODIUM CHLORIDE 0.9 % IV SOLN: 500.0000 mL | INTRAVENOUS | Status: DC

## 2022-12-16 NOTE — Progress Notes (Signed)
Called to room to assist during endoscopic procedure.  Patient ID and intended procedure confirmed with present staff. Received instructions for my participation in the procedure from the performing physician.  

## 2022-12-16 NOTE — Patient Instructions (Addendum)

## 2022-12-16 NOTE — Progress Notes (Signed)
[facility] History and Physical   Primary Care Physician:  Margaree Mackintosh, MD   Reason for Procedure:   History of colon polyps  Plan:    colonoscopy     HPI: Natalie Kelly is a 52 y.o. female  here for colonoscopy surveillance - 3 small SSPs removed 10/2019.   Patient denies any bowel symptoms at this time. No family history of colon cancer known. Otherwise feels well without any cardiopulmonary symptoms.   I have discussed risks / benefits of anesthesia and endoscopic procedure with Candida Peeling and they wish to proceed with the exams as outlined today.    Past Medical History:  Diagnosis Date   Allergy    seasonal allergies   Anxiety    Arthritis    toes   Depression    GERD (gastroesophageal reflux disease)    with certain foods/times of days-as needed meds   Hyperlipidemia    Migraine headache    Seborrhea    ear   Thyroid disease    hypothyroidism    Past Surgical History:  Procedure Laterality Date   CESAREAN SECTION  2001 AND 2004   ORIF PATELLA FRACTURE Right 2008   WISDOM TOOTH EXTRACTION  1990    Prior to Admission medications   Medication Sig Start Date End Date Taking? Authorizing Provider  Ascorbic Acid (VITAMIN C) 100 MG tablet Take 200 mg by mouth daily.   Yes [provider]  cetirizine (ZYRTEC) 10 MG chewable tablet Chew 10 mg by mouth daily.   Yes [provider]  Cholecalciferol (VITAMIN D) 2000 UNITS CAPS Take by mouth. 5000 units   Yes [provider]  ezetimibe (ZETIA) 10 MG tablet Take 1 tablet (10 mg total) by mouth daily. 08/26/22  Yes Baxley, Luanna Cole, MD  pravastatin (PRAVACHOL) 10 MG tablet Take 1 tablet (10 mg total) by mouth daily. 02/13/22  Yes Hilty, Lisette Abu, MD  raNITIdine HCl (ZANTAC PO) Take by mouth as needed.   Yes [provider]  SYNTHROID 100 MCG tablet Take 1 tablet (100 mcg total) by mouth daily before breakfast. 11/25/22  Yes Baxley, Luanna Cole, MD  topiramate (TOPAMAX)  50 MG tablet Take 1 tablet (50 mg total) by mouth daily. 08/26/22  Yes Baxley, Luanna Cole, MD  butalbital-acetaminophen-caffeine (FIORICET) [phone] MG tablet One po q 4-6 hours prn migraine headaches not to exceed 6 tabs in 24 hours 06/09/19   Margaree Mackintosh, MD  ELDERBERRY PO Take by mouth.    [provider]  EPINEPHrine 0.3 mg/0.3 mL IJ SOAJ injection Inject 0.3 mg into the muscle as needed. 05/25/21   Margaree Mackintosh, MD  IBUPROFEN PO Take by mouth as needed.    [provider]  meloxicam (MOBIC) 15 MG tablet Take 1 tablet (15 mg total) by mouth daily. Patient taking differently: Take 15 mg by mouth as needed. 05/25/21   Margaree Mackintosh, MD  NEOMYCIN-POLYMYXIN-HYDROCORTISONE (CORTISPORIN) 1 % SOLN OTIC solution Place 3 drops into both ears 4 (four) times daily. 02/13/22   Margaree Mackintosh, MD  SUMAtriptan Willette Brace) 20 MG/ACT nasal spray Use as directed for migraine headache 05/20/19   Margaree Mackintosh, MD  TURMERIC PO Take 3,000 mg by mouth daily. Reported on 07/20/2015    [provider]    Current Outpatient Medications  Medication Sig Dispense Refill   Ascorbic Acid (VITAMIN C) 100 MG tablet Take 200 mg by mouth daily.     cetirizine (ZYRTEC) 10 MG  chewable tablet Chew 10 mg by mouth daily.     Cholecalciferol (VITAMIN D) 2000 UNITS CAPS Take by mouth. 5000 units     ezetimibe (ZETIA) 10 MG tablet Take 1 tablet (10 mg total) by mouth daily. 90 tablet 1   pravastatin (PRAVACHOL) 10 MG tablet Take 1 tablet (10 mg total) by mouth daily. 90 tablet 3   raNITIdine HCl (ZANTAC PO) Take by mouth as needed.     SYNTHROID 100 MCG tablet Take 1 tablet (100 mcg total) by mouth daily before breakfast. 90 tablet 1   topiramate (TOPAMAX) 50 MG tablet Take 1 tablet (50 mg total) by mouth daily. 90 tablet 2   butalbital-acetaminophen-caffeine (FIORICET) 50-325-40 MG tablet One po q 4-6 hours prn migraine headaches not to exceed 6 tabs in 24 hours 30 tablet 2   ELDERBERRY PO Take by mouth.      EPINEPHrine 0.3 mg/0.3 mL IJ SOAJ injection Inject 0.3 mg into the muscle as needed. 2 each 1   IBUPROFEN PO Take by mouth as needed.     meloxicam (MOBIC) 15 MG tablet Take 1 tablet (15 mg total) by mouth daily. (Patient taking differently: Take 15 mg by mouth as needed.) 90 tablet 0   NEOMYCIN-POLYMYXIN-HYDROCORTISONE (CORTISPORIN) 1 % SOLN OTIC solution Place 3 drops into both ears 4 (four) times daily. 10 mL 1   SUMAtriptan (IMITREX) 20 MG/ACT nasal spray Use as directed for migraine headache 1 Inhaler 3   TURMERIC PO Take 3,000 mg by mouth daily. Reported on 07/20/2015     Current Facility-Administered Medications  Medication Dose Route Frequency Provider Last Rate Last Admin   0.9 %  sodium chloride infusion  500 mL Intravenous Continuous Latasia Silberstein, Willaim Rayas, MD        Allergies as of 12/16/2022 - Review Complete 12/16/2022  Allergen Reaction Noted   Rosuvastatin calcium Other (See Comments) 09/29/2018    Family History  Problem Relation Age of Onset   Stroke Father    Hypertension Father    Hyperlipidemia Father    Diabetes Father    Colon polyps Father 55   Hyperlipidemia Mother    Asthma Daughter    Congestive Heart Failure Maternal Grandmother    Parkinson's disease Paternal Grandmother    Colon cancer Neg Hx    Esophageal cancer Neg Hx    Rectal cancer Neg Hx    Stomach cancer Neg Hx     Social History   Socioeconomic History   Marital status: Married    Spouse name: Not on file   Number of children: Not on file   Years of education: Not on file   Highest education level: Not on file  Occupational History   Not on file  Tobacco Use   Smoking status: Never   Smokeless tobacco: Never  Vaping Use   Vaping status: Never Used  Substance and Sexual Activity   Alcohol use: No   Drug use: No   Sexual activity: Not on file  Other Topics Concern   Not on file  Social History Narrative   Not on file   Social Determinants of Health   Financial Resource  Strain: Not on file  Food Insecurity: Not on file  Transportation Needs: Not on file  Physical Activity: Not on file  Stress: Not on file  Social Connections: Not on file  Intimate Partner Violence: Not on file    Review of Systems: All other review of systems negative except as mentioned in the HPI.  Physical Exam: Vital signs BP 137/89   Pulse 70   Temp 97.9 F (36.6 C)   Ht 5\' 4"  (1.626 m)   Wt 178 lb (80.7 kg)   LMP 02/06/2021   SpO2 99%   BMI 30.55 kg/m   General:   Alert,  Well-developed, pleasant and cooperative in NAD Lungs:  Clear throughout to auscultation.   Heart:  Regular rate and rhythm Abdomen:  Soft, nontender and nondistended.   Neuro/Psych:  Alert and cooperative. Normal mood and affect. A and O x 3  Harlin Rain, MD [facility]

## 2022-12-16 NOTE — Progress Notes (Signed)
VS by [facility]  Pt's states no medical or surgical changes since previsit or office visit.

## 2022-12-16 NOTE — Op Note (Signed)
[facility] Patient Name: Natalie Kelly Procedure Date: 12/16/2022 9:01 AM MRN: [id number] Endoscopist: Viviann Spare P. Adela Lank , MD, [id number] Age: 52 Referring MD:  Date of Birth: 03/29/1970 Gender: Female Account #: [id number] Procedure:                Colonoscopy Indications:              High risk colon cancer surveillance: Personal                            history of colonic polyps - 3 sessile serrated                            polyps removed 10/2019 Medicines:                Monitored Anesthesia Care Procedure:                Pre-Anesthesia Assessment:                           - Prior to the procedure, a History and Physical                            was performed, and patient medications and                            allergies were reviewed. The patient's tolerance of                            previous anesthesia was also reviewed. The risks                            and benefits of the procedure and the sedation                            options and risks were discussed with the patient.                            All questions were answered, and informed consent                            was obtained. Prior Anticoagulants: The patient has                            taken no anticoagulant or antiplatelet agents. ASA                            Grade Assessment: II - A patient with mild systemic                            disease. After reviewing the risks and benefits,                            the patient was deemed in satisfactory condition to  undergo the procedure.                           After obtaining informed consent, the colonoscope                            was passed under direct vision. Throughout the                            procedure, the patient's blood pressure, pulse, and                            oxygen saturations were monitored continuously. The                            Olympus Scope SN [id number] was  introduced through the                            anus and advanced to the the cecum, identified by                            appendiceal orifice and ileocecal valve. The                            colonoscopy was performed without difficulty. The                            patient tolerated the procedure well. The quality                            of the bowel preparation was good. The ileocecal                            valve, appendiceal orifice, and rectum were                            photographed. Scope In: 9:05:55 AM Scope Out: 9:28:23 AM Scope Withdrawal Time: 0 hours 18 minutes 8 seconds  Total Procedure Duration: 0 hours 22 minutes 28 seconds  Findings:                 The perianal and digital rectal examinations were                            normal.                           A diminutive polyp was found in the cecum. The                            polyp was sessile. The polyp was removed with a                            cold snare. Resection and retrieval were complete.  A diminutive polyp was found in the ascending                            colon. The polyp was sessile. The polyp was removed                            with a cold snare. Resection and retrieval were                            complete.                           Four flat polyps were found in the transverse                            colon. The polyps were 2 to 4 mm in size. These                            polyps were removed with a cold snare. Resection                            and retrieval were complete.                           The procedure was prolonged by colonic spasm.                           Anal papilla(e) were hypertrophied.                           The exam was otherwise without abnormality. Complications:            No immediate complications. Estimated blood loss:                            Minimal. Estimated Blood Loss:     Estimated blood loss was  minimal. Impression:               - One diminutive polyp in the cecum, removed with a                            cold snare. Resected and retrieved.                           - One diminutive polyp in the ascending colon,                            removed with a cold snare. Resected and retrieved.                           - Four 2 to 4 mm polyps in the transverse colon,                            removed with a cold snare. Resected and retrieved.                           -  Colonic spasm.                           - Anal papilla(e) were hypertrophied.                           - The examination was otherwise normal. Recommendation:           - Patient has a contact number available for                            emergencies. The signs and symptoms of potential                            delayed complications were discussed with the                            patient. Return to normal activities tomorrow.                            Written discharge instructions were provided to the                            patient.                           - Resume previous diet.                           - Continue present medications.                           - Await pathology results. Viviann Spare P. Shay Jhaveri, MD 12/16/2022 9:33:41 AM This report has been signed electronically.

## 2022-12-16 NOTE — Progress Notes (Signed)
Sedate, gd SR's, VSS, report to RN 

## 2022-12-17 ENCOUNTER — Telehealth: Payer: Self-pay

## 2022-12-17 NOTE — Telephone Encounter (Signed)
Left message on answering machine. 

## 2022-12-18 LAB — SURGICAL PATHOLOGY

## 2023-01-17 DIAGNOSIS — H0288B Meibomian gland dysfunction left eye, upper and lower eyelids: Secondary | ICD-10-CM | POA: Diagnosis not present

## 2023-01-17 DIAGNOSIS — H0288A Meibomian gland dysfunction right eye, upper and lower eyelids: Secondary | ICD-10-CM | POA: Diagnosis not present

## 2023-01-17 DIAGNOSIS — H04123 Dry eye syndrome of bilateral lacrimal glands: Secondary | ICD-10-CM | POA: Diagnosis not present

## 2023-01-17 DIAGNOSIS — H1045 Other chronic allergic conjunctivitis: Secondary | ICD-10-CM | POA: Diagnosis not present

## 2023-02-18 DIAGNOSIS — E782 Mixed hyperlipidemia: Secondary | ICD-10-CM | POA: Diagnosis not present

## 2023-02-18 DIAGNOSIS — E039 Hypothyroidism, unspecified: Secondary | ICD-10-CM | POA: Diagnosis not present

## 2023-02-18 DIAGNOSIS — R7303 Prediabetes: Secondary | ICD-10-CM | POA: Diagnosis not present

## 2023-02-18 DIAGNOSIS — E559 Vitamin D deficiency, unspecified: Secondary | ICD-10-CM | POA: Diagnosis not present

## 2023-02-18 DIAGNOSIS — Z8616 Personal history of COVID-19: Secondary | ICD-10-CM | POA: Diagnosis not present

## 2023-02-19 ENCOUNTER — Other Ambulatory Visit: Payer: Self-pay | Admitting: Internal Medicine

## 2023-02-19 ENCOUNTER — Other Ambulatory Visit (HOSPITAL_COMMUNITY): Payer: Self-pay

## 2023-02-19 ENCOUNTER — Other Ambulatory Visit: Payer: Self-pay

## 2023-02-19 MED ORDER — PRAVASTATIN SODIUM 10 MG PO TABS
10.0000 mg | ORAL_TABLET | Freq: Every day | ORAL | 0 refills | Status: DC
  Filled 2023-02-19: qty 15, 15d supply, fill #0

## 2023-02-20 ENCOUNTER — Other Ambulatory Visit: Payer: Self-pay

## 2023-02-20 ENCOUNTER — Other Ambulatory Visit (HOSPITAL_COMMUNITY): Payer: Self-pay

## 2023-02-20 MED ORDER — EZETIMIBE 10 MG PO TABS
10.0000 mg | ORAL_TABLET | Freq: Every day | ORAL | 1 refills | Status: DC
  Filled 2023-02-20: qty 90, 90d supply, fill #0
  Filled 2023-05-27: qty 90, 90d supply, fill #1

## 2023-03-07 ENCOUNTER — Encounter (HOSPITAL_COMMUNITY): Payer: Self-pay

## 2023-03-07 ENCOUNTER — Other Ambulatory Visit (HOSPITAL_COMMUNITY): Payer: Self-pay

## 2023-03-07 ENCOUNTER — Other Ambulatory Visit: Payer: Self-pay | Admitting: Internal Medicine

## 2023-03-11 ENCOUNTER — Encounter (HOSPITAL_COMMUNITY): Payer: Self-pay

## 2023-03-11 ENCOUNTER — Other Ambulatory Visit (HOSPITAL_COMMUNITY): Payer: Self-pay

## 2023-03-12 ENCOUNTER — Other Ambulatory Visit (HOSPITAL_COMMUNITY): Payer: Self-pay

## 2023-03-12 ENCOUNTER — Other Ambulatory Visit: Payer: Self-pay | Admitting: Internal Medicine

## 2023-03-12 MED ORDER — PRAVASTATIN SODIUM 10 MG PO TABS
10.0000 mg | ORAL_TABLET | Freq: Every day | ORAL | 1 refills | Status: DC
  Filled 2023-03-12: qty 90, 90d supply, fill #0
  Filled 2023-06-11: qty 90, 90d supply, fill #1

## 2023-03-12 NOTE — Telephone Encounter (Signed)
This RN contacted the patient who states that the prescription was filled by Dr. Rennis Golden but was told she did not need to continually see him- that PCP could write for this medication. Patient reports she has an annual visit with PCP in May but is asking if she needs one prior for Dr. Lenord Fellers to fill this med. Requesting f/u from PCP office.

## 2023-03-12 NOTE — Telephone Encounter (Signed)
Copied from CRM [id number]. Topic: Clinical - Medication Refill >> Mar 12, 2023  8:23 AM Bobbye Morton wrote: Most Recent Primary Care Visit:  Provider: Margaree Mackintosh  Department: [facility]  Visit Type: PHYSICAL 45  Date: 06/21/2022  Medication: [id number]  pravastatin (PRAVACHOL) 10 MG tablet   Has the patient contacted their pharmacy? Yes (Agent: If no, request that the patient contact the pharmacy for the refill. If patient does not wish to contact the pharmacy document the reason why and proceed with request.) (Agent: If yes, when and what did the pharmacy advise?)  Is this the correct pharmacy for this prescription? Yes If no, delete pharmacy and type the correct one.  This is the patient's preferred pharmacy:  [facility] [address] [place] [address] Phone: [phone] Fax: [phone]   Has the prescription been filled recently? No  Is the patient out of the medication? Yes  Has the patient been seen for an appointment in the last year OR does the patient have an upcoming appointment? Yes  Can we respond through MyChart? Yes  Agent: Please be advised that Rx refills may take up to 3 business days. We ask that you follow-up with your pharmacy.

## 2023-03-13 ENCOUNTER — Ambulatory Visit: Payer: 59

## 2023-05-27 ENCOUNTER — Other Ambulatory Visit (HOSPITAL_COMMUNITY): Payer: Self-pay

## 2023-05-27 ENCOUNTER — Other Ambulatory Visit: Payer: Self-pay | Admitting: Internal Medicine

## 2023-05-27 ENCOUNTER — Other Ambulatory Visit: Payer: Self-pay

## 2023-05-27 MED ORDER — TOPIRAMATE 50 MG PO TABS
50.0000 mg | ORAL_TABLET | Freq: Every day | ORAL | 2 refills | Status: DC
  Filled 2023-05-27: qty 90, 90d supply, fill #0

## 2023-05-27 MED ORDER — SYNTHROID 100 MCG PO TABS
100.0000 ug | ORAL_TABLET | Freq: Every day | ORAL | 1 refills | Status: DC
  Filled 2023-05-27: qty 90, 90d supply, fill #0

## 2023-05-28 ENCOUNTER — Other Ambulatory Visit: Payer: Self-pay

## 2023-06-11 NOTE — Progress Notes (Signed)
 Annual Wellness Visit   Patient Care Team: Natalie Kelly, Natalie Meyers, MD as PCP - General (Internal Medicine) Natalie Spar Aviva Lemmings, MD as PCP - Cardiology (Cardiology) Natalie Kelly, RD as Dietitian (Family Medicine)  Visit Date: 06/27/23   Chief Complaint  Patient presents with   Annual Exam   Subjective:  Patient: Natalie Kelly, Female DOB: 1970-12-18, 53 y.o. MRN: [id number] Natalie Kelly is a 53 y.o. Female who presents today for her Annual Wellness Visit. Patient has Hypothyroidism; Hyperlipidemia; Migraine Headache; BMI 29.0-29.9,Adult; Bilateral Adhesive Capsulitis, Shoulders; Situational Stress; and Allergic Rhinitis.  History of Hyperlipidemia treated with Pravastatin  10 mg daily and Zetia  10 mg daily. 06/24/2023 Lipid Panel, compared to 06/2022: HDL 47, decreased from 50; LDL 102, elevated from 94; Triglycerides 178, decreased from 194. 2023 Coronary Calcium  Score: 0.  History of Diabetes Mellitus, type II with 06/24/2023 HgbA1c 6.4. Followed by Dr. Laurence Kelly, Endocrinologist, who she saw in January and will follow-up in July.   History of Hypothyroidism treated with Synthroid  100 mcg daily. 06/24/2023 TSH: 0.84.  History of Obesity; BMI 29+, today 32.26 weighing  185 pounds. Says that she would like to lose about 20-30 pounds, but with her busy schedule has difficulty maintaining a consistent diet and exercise regimen.   History of Migraine Headaches treated with Topamax  50 mg daily, Fioricet  50-325-40 mg as needed every 4-6 hours not to exceed 6 tablets in 24 hours, and Sumatriptan  20 mg nasal spray. Says that she hasn't had to use her Imitrex  in about a year now, hasn't had a what she considers a migraine in about 2 years, and she is now menopausal, so wanted to discuss weaning off Topamax .   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   History of Seborrhea, Ears treated with Cortisporin  OTIC solution 4  drops, both ears 4 time daily as needed.   History of Vitamin-D Deficiency treated with Vitamin-D 2000 units. Last Vitamin-D was 39 in 2023.   Labs 06/24/2023 CBC, compared to 06/2022: RBC 5.17, elevated from 5.01; otherwise WNL CMP: WNL   Followed by Dr. McComb at [facility] for her Pap Smears, Mammograms, and Bone Density. Pelvic and breat exam deferred. Last pap and mammogram done 11/21/2022. Bone Density done 10/2020, osteopenic.    Colonoscopy 12/16/2022 removed 6 polyps total - 1 diminutive sessile polyp removed from cecum and ascending colon and 4 flat polyps 2-4 mm from transverse colon (found to be sessile serrated polyp(s) w/o dysplasia or malignancy, polypoid fragment of benign colonic mucosa w/ lymphoid aggregate, and polypoid fragment of benign colonic mucosa w/ mild hyperplastic changes per pathology); Hypertrophied Anal Papillae; exam was prolonged by a colonic spasm but was otherwise normal with repeat recommendation of 2029.  Vaccine Counseling: Due for Flu and Covid-19; UTD on Flu, Shingles 2/2, and Tdap. Past Medical History:  Diagnosis Date   Allergy    seasonal allergies   Anxiety    Arthritis    toes   Depression    GERD (gastroesophageal reflux disease)    with certain foods/times of days-as needed meds   Hyperlipidemia    Migraine headache    Seborrhea    ear   Thyroid  disease    hypothyroidism   Medical/Surgical History Narrative:   Allergic/Intolerant to: Rosuvastatin  Calcium  - elevates liver enzymes  2008 - Right ORIF Patella Fracture  2001 & 2004 - Cesarean Section  1990 - Wisdom Tooth Extraction  Family History  Problem Relation Age of Onset   Stroke Father    Hypertension Father    Hyperlipidemia Father    Diabetes Father    Colon polyps Father 75   Hyperlipidemia Mother    Asthma Daughter    Congestive Heart Failure Maternal Grandmother    Parkinson's disease Paternal Grandmother    Colon cancer Neg Hx    Esophageal cancer Neg  Hx    Rectal cancer Neg Hx    Stomach cancer Neg Hx    Family History Narrative: No Family History of GI Cancers Paternal Grandmother w/ hx of Parkinson's Disease Father w/ hx of Hypertension, Hyperlipidemia, Diabetes, Stroke, Headaches, and Colon Polyps (onset age 41) Maternal Grandmother w/ hx of Congestive Heart Failure Mother w/ hx of Headaches and Hyperlipidemia Brother w/ hx of Headaches Daughter w/ hx of Asthma  Social History   Social History Narrative   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 Married. 2 adult children - youngest is 88 y/o; oldest lives in [place]. Non-smoker or drinker.   Review of Systems  Constitutional:  Negative for chills, fever, malaise/fatigue and weight loss.  HENT:  Negative for hearing loss, sinus pain and sore throat.   Respiratory:  Negative for cough, hemoptysis and shortness of breath.   Cardiovascular:  Negative for chest pain, palpitations, leg swelling and PND.  Gastrointestinal:  Negative for abdominal pain, constipation, diarrhea, heartburn, nausea and vomiting.  Genitourinary:  Negative for dysuria, frequency and urgency.  Musculoskeletal:  Negative for back pain, myalgias and neck pain.  Skin:  Negative for itching and rash.  Neurological:  Negative for dizziness, tingling, seizures and headaches.  Endo/Heme/Allergies:  Negative for polydipsia.  Psychiatric/Behavioral:  Negative for depression. The patient is not nervous/anxious.     Objective:  Vitals: BP 130/80   Pulse 74   Ht 5' 3.5" (1.613 m)   Wt 185 lb (83.9 kg)   LMP 02/06/2021   SpO2 98%   BMI 32.26 kg/m  Physical Exam Vitals and nursing note reviewed.  Constitutional:      General: She is not in acute distress.    Appearance: Normal appearance. She is not ill-appearing or toxic-appearing.  HENT:     Head: Normocephalic and atraumatic.     Right Ear: Hearing, tympanic membrane, ear canal and external ear normal.     Left Ear: Hearing, tympanic  membrane, ear canal and external ear normal.     Mouth/Throat:     Pharynx: Oropharynx is clear.  Eyes:     Extraocular Movements: Extraocular movements intact.     Pupils: Pupils are equal, round, and reactive to light.  Neck:     Thyroid : No thyroid  mass, thyromegaly or thyroid  tenderness.     Vascular: No carotid bruit.  Cardiovascular:     Rate and Rhythm: Normal rate and regular rhythm. No extrasystoles are present.    Pulses:          Dorsalis pedis pulses are 2+ on the right side and 2+ on the left side.     Heart sounds: Normal heart sounds. No murmur heard.    No friction rub. No gallop.  Pulmonary:     Effort: Pulmonary effort is normal.     Breath sounds: Normal breath sounds. No decreased breath sounds, wheezing, rhonchi or rales.  Chest:     Chest wall: No mass.  Abdominal:     Palpations: Abdomen is soft. There is no hepatomegaly, splenomegaly or  mass.     Tenderness: There is no abdominal tenderness.     Hernia: No hernia is present.  Musculoskeletal:     Cervical back: Normal range of motion.     Right lower leg: No edema.     Left lower leg: No edema.  Lymphadenopathy:     Cervical: No cervical adenopathy.     Upper Body:     Right upper body: No supraclavicular adenopathy.     Left upper body: No supraclavicular adenopathy.  Skin:    General: Skin is warm and dry.  Neurological:     General: No focal deficit present.     Mental Status: She is alert and oriented to person, place, and time. Mental status is at baseline.     Sensory: Sensation is intact.     Motor: Motor function is intact. No weakness.     Deep Tendon Reflexes: Reflexes are normal and symmetric.  Psychiatric:        Attention and Perception: Attention normal.        Mood and Affect: Mood normal.        Speech: Speech normal.        Behavior: Behavior normal.        Thought Content: Thought content normal.        Cognition and Memory: Cognition normal.        Judgment: Judgment normal.    Most Recent Fall Risk Assessment:    06/21/2022   11:11 AM  Fall Risk   Falls in the past year? 1  Number falls in past yr: 0  Injury with Fall? 0  Risk for fall due to : No Fall Risks  Follow up Falls prevention discussed   Most Recent Depression Screenings:    06/27/2023    3:06 PM 06/21/2022   11:11 AM  PHQ 2/9 Scores  PHQ - 2 Score 0 0   Results:  Studies Obtained And Personally Reviewed By Me:  Bone Density done 10/2020, osteopenic.   Colonoscopy 12/16/2022 removed 6 polyps total - 1 diminutive sessile polyp removed from cecum and ascending colon and 4 flat polyps 2-4 mm from transverse colon (found to be sessile serrated polyp(s) w/o dysplasia or malignancy, polypoid fragment of benign colonic mucosa w/ lymphoid aggregate, and polypoid fragment of benign colonic mucosa w/ mild hyperplastic changes per pathology); Hypertrophied Anal Papillae; exam was prolonged by a colonic spasm but was otherwise normal.  Labs:     Component Value Date/Time   NA 142 06/24/2023 1002   NA 147 (H) 01/01/2021 1126   K 4.8 06/24/2023 1002   CL 108 06/24/2023 1002   CO2 25 06/24/2023 1002   GLUCOSE 99 06/24/2023 1002   BUN 15 06/24/2023 1002   BUN 17 01/01/2021 1126   CREATININE 0.81 06/24/2023 1002   CALCIUM  9.7 06/24/2023 1002   PROT 7.2 06/24/2023 1002   PROT 7.1 01/01/2021 1126   ALBUMIN 4.5 01/01/2021 1126   AST 22 06/24/2023 1002   ALT 29 06/24/2023 1002   ALKPHOS 102 01/01/2021 1126   BILITOT 0.6 06/24/2023 1002   BILITOT 0.4 01/01/2021 1126   GFRNONAA 91 05/19/2020 1043   GFRAA 105 05/19/2020 1043    Lab Results  Component Value Date   WBC 7.7 06/24/2023   HGB 14.5 06/24/2023   HCT 43.7 06/24/2023   MCV 84.5 06/24/2023   PLT 352 06/24/2023   Lab Results  Component Value Date   CHOL 176 06/24/2023   HDL 47 (L) 06/24/2023  LDLCALC 102 (H) 06/24/2023   TRIG 178 (H) 06/24/2023   CHOLHDL 3.7 06/24/2023   Lab Results  Component Value Date   HGBA1C 6.4 (H)  06/24/2023    Lab Results  Component Value Date   TSH 0.84 06/24/2023    Assessment & Plan:   Orders Placed This Encounter  Procedures   POCT URINALYSIS DIP (CLINITEK)  Other Labs Reviewed today: CBC, compared to 06/2022: RBC 5.17, elevated from 5.01; otherwise WNL CMP: WNL  Hyperlipidemia treated with Pravastatin  10 mg daily and Zetia  10 mg daily. 06/24/2023 Lipid Panel, compared to 06/2022: HDL 47, decreased from 50; LDL 102, elevated from 94; Triglycerides 178, decreased from 194. 2023 Coronary Calcium  Score: 0. Refilled Zetia  today.   Diabetes Mellitus, type II with 06/24/2023 HgbA1c 6.4. Followed by Dr. Laurence Kelly, Endocrinologist, who she saw in January and will follow-up in July.   Hypothyroidism treated with Synthroid  100 mcg daily. 06/24/2023 TSH: 0.84. Refilled Synthroid  today.  Obesity; BMI 29+, today 32.26 weighing  185 pounds. Says that she would like to lose about 20-30 pounds, but with her busy schedule has difficulty maintaining a consistent diet and exercise regimen.   Migraine Headaches treated with Topamax  50 mg daily, Fioricet  50-325-40 mg as needed every 4-6 hours not to exceed 6 tablets in 24 hours, and Sumatriptan  20 mg nasal spray. Says that she hasn't had to use her Imitrex  in about a year now, hasn't had a what she considers a migraine in about 2 years, and she is now menopausal, so wanted to discuss weaning off Topamax . Recommended gradual titration over a period. Refilled Topamax , Fioricet , and Sumatriptan  nasal spray today.   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 Refilled Meloxicam  today.   Seborrhea, Ears treated with Cortisporin  OTIC solution 4 drops, both ears 4 time daily as needed.   Vitamin-D Deficiency treated with Vitamin-D 2000 units. Last Vitamin-D was 39 in 2023.   Followed by Dr. McComb at [facility] for her Pap Smears, Mammograms, and Bone Density. Pelvic and breat exam  deferred. Last pap and mammogram done 11/21/2022. Bone Density done 10/2020, osteopenic.    Colonoscopy 12/16/2022 removed 6 polyps total - 1 diminutive sessile polyp removed from cecum and ascending colon and 4 flat polyps 2-4 mm from transverse colon (found to be sessile serrated polyp(s) w/o dysplasia or malignancy, polypoid fragment of benign colonic mucosa w/ lymphoid aggregate, and polypoid fragment of benign colonic mucosa w/ mild hyperplastic changes per pathology); Hypertrophied Anal Papillae; exam was prolonged by a colonic spasm but was otherwise normal with repeat recommendation of 2029.  Vaccine Counseling: Due for Flu and Covid-19; UTD on Flu, Shingles 2/2, and Tdap.  Return in 1 year (on 06/29/2024) for annual labs and then on 07/02/2024 for annual visit.   Annual wellness visit done today including the all of the following: Reviewed patient's Family Medical History Reviewed and updated list of patient's medical providers Assessment of cognitive impairment was done Assessed patient's functional ability Established a written schedule for health screening services Health Risk Assessent Completed and Reviewed  Discussed health benefits of physical activity, and encouraged her to engage in regular exercise appropriate for her age and condition.    I,Emily Lagle,acting as a Neurosurgeon for Sylvan Evener, MD.,have documented all relevant documentation on the behalf of Sylvan Evener, MD,as directed by  Sylvan Evener, MD while in the presence of Sylvan Evener, MD.   Lamona Pilon  Rosi Converse, MD, have reviewed all documentation for this visit. The documentation on 07/08/23 for the exam, diagnosis, procedures, and orders are all accurate and complete.

## 2023-06-24 ENCOUNTER — Other Ambulatory Visit: Payer: 59

## 2023-06-24 DIAGNOSIS — Z0000 Encounter for general adult medical examination without abnormal findings: Secondary | ICD-10-CM | POA: Diagnosis not present

## 2023-06-24 DIAGNOSIS — E039 Hypothyroidism, unspecified: Secondary | ICD-10-CM | POA: Diagnosis not present

## 2023-06-24 DIAGNOSIS — R7302 Impaired glucose tolerance (oral): Secondary | ICD-10-CM | POA: Diagnosis not present

## 2023-06-24 DIAGNOSIS — E782 Mixed hyperlipidemia: Secondary | ICD-10-CM

## 2023-06-24 DIAGNOSIS — F419 Anxiety disorder, unspecified: Secondary | ICD-10-CM

## 2023-06-25 ENCOUNTER — Ambulatory Visit: Payer: Self-pay | Admitting: Internal Medicine

## 2023-06-25 LAB — LIPID PANEL
Cholesterol: 176 mg/dL (ref <200)
HDL: 47 mg/dL — ABNORMAL LOW (ref >=50)
LDL Cholesterol (Calc): 102 mg/dL — ABNORMAL HIGH
Non-HDL Cholesterol (Calc): 129 mg/dL (ref <130)
Total CHOL/HDL Ratio: 3.7 (calc) (ref <5.0)
Triglycerides: 178 mg/dL — ABNORMAL HIGH (ref <150)

## 2023-06-25 LAB — CBC WITH DIFFERENTIAL/PLATELET
Absolute Lymphocytes: 2541 {cells}/uL (ref 850–3900)
Absolute Monocytes: 616 {cells}/uL (ref 200–950)
Basophils Absolute: 77 {cells}/uL (ref 0–200)
Basophils Relative: 1 %
Eosinophils Absolute: 231 {cells}/uL (ref 15–500)
Eosinophils Relative: 3 %
HCT: 43.7 % (ref 35.0–45.0)
Hemoglobin: 14.5 g/dL (ref 11.7–15.5)
MCH: 28 pg (ref 27.0–33.0)
MCHC: 33.2 g/dL (ref 32.0–36.0)
MCV: 84.5 fL (ref 80.0–100.0)
MPV: 9.8 fL (ref 7.5–12.5)
Monocytes Relative: 8 %
Neutro Abs: 4235 {cells}/uL (ref 1500–7800)
Neutrophils Relative %: 55 %
Platelets: 352 10*3/uL (ref 140–400)
RBC: 5.17 10*6/uL — ABNORMAL HIGH (ref 3.80–5.10)
RDW: 12.7 % (ref 11.0–15.0)
Total Lymphocyte: 33 %
WBC: 7.7 10*3/uL (ref 3.8–10.8)

## 2023-06-25 LAB — COMPLETE METABOLIC PANEL WITHOUT GFR
AG Ratio: 1.6 (calc) (ref 1.0–2.5)
ALT: 29 U/L (ref 6–29)
AST: 22 U/L (ref 10–35)
Albumin: 4.4 g/dL (ref 3.6–5.1)
Alkaline phosphatase (APISO): 90 U/L (ref 37–153)
BUN: 15 mg/dL (ref 7–25)
CO2: 25 mmol/L (ref 20–32)
Calcium: 9.7 mg/dL (ref 8.6–10.4)
Chloride: 108 mmol/L (ref 98–110)
Creat: 0.81 mg/dL (ref 0.50–1.03)
Globulin: 2.8 g/dL (ref 1.9–3.7)
Glucose, Bld: 99 mg/dL (ref 65–99)
Potassium: 4.8 mmol/L (ref 3.5–5.3)
Sodium: 142 mmol/L (ref 135–146)
Total Bilirubin: 0.6 mg/dL (ref 0.2–1.2)
Total Protein: 7.2 g/dL (ref 6.1–8.1)

## 2023-06-25 LAB — MICROALBUMIN / CREATININE URINE RATIO
Creatinine, Urine: 156 mg/dL (ref 20–275)
Microalb Creat Ratio: 2 mg/g{creat} (ref <30)
Microalb, Ur: 0.3 mg/dL

## 2023-06-25 LAB — HEMOGLOBIN A1C
Hgb A1c MFr Bld: 6.4 % — ABNORMAL HIGH (ref <5.7)
Mean Plasma Glucose: 137 mg/dL
eAG (mmol/L): 7.6 mmol/L

## 2023-06-25 LAB — TSH: TSH: 0.84 m[IU]/L

## 2023-06-27 ENCOUNTER — Ambulatory Visit: Payer: 59 | Admitting: Internal Medicine

## 2023-06-27 ENCOUNTER — Encounter: Payer: Self-pay | Admitting: Internal Medicine

## 2023-06-27 ENCOUNTER — Other Ambulatory Visit: Payer: Self-pay

## 2023-06-27 ENCOUNTER — Other Ambulatory Visit (HOSPITAL_COMMUNITY): Payer: Self-pay

## 2023-06-27 VITALS — BP 130/80 | HR 74 | Ht 63.5 in | Wt 185.0 lb

## 2023-06-27 DIAGNOSIS — E785 Hyperlipidemia, unspecified: Secondary | ICD-10-CM | POA: Diagnosis not present

## 2023-06-27 DIAGNOSIS — L299 Pruritus, unspecified: Secondary | ICD-10-CM

## 2023-06-27 DIAGNOSIS — Z8639 Personal history of other endocrine, nutritional and metabolic disease: Secondary | ICD-10-CM

## 2023-06-27 DIAGNOSIS — M7501 Adhesive capsulitis of right shoulder: Secondary | ICD-10-CM

## 2023-06-27 DIAGNOSIS — Z6832 Body mass index (BMI) 32.0-32.9, adult: Secondary | ICD-10-CM | POA: Diagnosis not present

## 2023-06-27 DIAGNOSIS — M7502 Adhesive capsulitis of left shoulder: Secondary | ICD-10-CM | POA: Diagnosis not present

## 2023-06-27 DIAGNOSIS — Z0000 Encounter for general adult medical examination without abnormal findings: Secondary | ICD-10-CM | POA: Diagnosis not present

## 2023-06-27 DIAGNOSIS — Z8669 Personal history of other diseases of the nervous system and sense organs: Secondary | ICD-10-CM

## 2023-06-27 DIAGNOSIS — F419 Anxiety disorder, unspecified: Secondary | ICD-10-CM

## 2023-06-27 DIAGNOSIS — E039 Hypothyroidism, unspecified: Secondary | ICD-10-CM

## 2023-06-27 DIAGNOSIS — E559 Vitamin D deficiency, unspecified: Secondary | ICD-10-CM

## 2023-06-27 DIAGNOSIS — L219 Seborrheic dermatitis, unspecified: Secondary | ICD-10-CM | POA: Diagnosis not present

## 2023-06-27 DIAGNOSIS — G43909 Migraine, unspecified, not intractable, without status migrainosus: Secondary | ICD-10-CM

## 2023-06-27 DIAGNOSIS — M7918 Myalgia, other site: Secondary | ICD-10-CM

## 2023-06-27 DIAGNOSIS — Z1231 Encounter for screening mammogram for malignant neoplasm of breast: Secondary | ICD-10-CM

## 2023-06-27 DIAGNOSIS — E119 Type 2 diabetes mellitus without complications: Secondary | ICD-10-CM

## 2023-06-27 DIAGNOSIS — R7302 Impaired glucose tolerance (oral): Secondary | ICD-10-CM

## 2023-06-27 DIAGNOSIS — E782 Mixed hyperlipidemia: Secondary | ICD-10-CM

## 2023-06-27 LAB — POCT URINALYSIS DIP (CLINITEK)
Bilirubin, UA: NEGATIVE
Blood, UA: NEGATIVE
Glucose, UA: NEGATIVE mg/dL
Ketones, POC UA: NEGATIVE mg/dL
Leukocytes, UA: NEGATIVE
Nitrite, UA: NEGATIVE
POC PROTEIN,UA: NEGATIVE
Spec Grav, UA: 1.01 (ref 1.010–1.025)
Urobilinogen, UA: 0.2 U/dL
pH, UA: 6.5 (ref 5.0–8.0)

## 2023-06-27 MED ORDER — MELOXICAM 15 MG PO TABS
15.0000 mg | ORAL_TABLET | Freq: Every day | ORAL | 1 refills | Status: AC
  Filled 2023-06-27: qty 90, 90d supply, fill #0

## 2023-06-27 MED ORDER — TOPIRAMATE 50 MG PO TABS
50.0000 mg | ORAL_TABLET | Freq: Every day | ORAL | 2 refills | Status: AC
  Filled 2023-06-27 – 2023-09-18 (×2): qty 90, 90d supply, fill #0

## 2023-06-27 MED ORDER — EZETIMIBE 10 MG PO TABS
10.0000 mg | ORAL_TABLET | Freq: Every day | ORAL | 3 refills | Status: AC
  Filled 2023-06-27 – 2023-08-26 (×2): qty 90, 90d supply, fill #0
  Filled 2023-11-21: qty 90, 90d supply, fill #1
  Filled 2024-03-01: qty 90, 90d supply, fill #2

## 2023-06-27 MED ORDER — BUTALBITAL-APAP-CAFFEINE 50-325-40 MG PO TABS
1.0000 | ORAL_TABLET | ORAL | 2 refills | Status: AC | PRN
  Filled 2023-06-27: qty 30, 5d supply, fill #0

## 2023-06-27 MED ORDER — SUMATRIPTAN 20 MG/ACT NA SOLN
20.0000 mg | NASAL | 0 refills | Status: AC | PRN
  Filled 2023-06-27: qty 6, 30d supply, fill #0

## 2023-06-27 MED ORDER — SYNTHROID 100 MCG PO TABS
100.0000 ug | ORAL_TABLET | Freq: Every day | ORAL | 1 refills | Status: DC
  Filled 2023-06-27 – 2023-08-26 (×2): qty 90, 90d supply, fill #0
  Filled 2023-11-21: qty 90, 90d supply, fill #1

## 2023-07-02 ENCOUNTER — Other Ambulatory Visit: Payer: Self-pay | Admitting: Internal Medicine

## 2023-07-02 ENCOUNTER — Other Ambulatory Visit (HOSPITAL_COMMUNITY): Payer: Self-pay

## 2023-07-03 ENCOUNTER — Other Ambulatory Visit (HOSPITAL_COMMUNITY): Payer: Self-pay

## 2023-07-03 MED ORDER — NEOMYCIN-POLYMYXIN-HC 1 % OT SOLN
3.0000 [drp] | Freq: Four times a day (QID) | OTIC | 1 refills | Status: AC
  Filled 2023-07-03: qty 10, 8d supply, fill #0

## 2023-07-08 ENCOUNTER — Encounter: Payer: Self-pay | Admitting: Internal Medicine

## 2023-07-08 NOTE — Patient Instructions (Addendum)
 It was a pleasure to see you today. Use Cortisporin  otic as directed for ear itching. Take Meloxicam  as needed for musculoskeletal pain. Continue migraine prophylaxis. Continue lipid lowering medication. Continue Endocrinology follow up. Continue Vitamin D  supplement.Colonoscopy is up to date. Sees GYN annually. Return in one year or as needed.

## 2023-07-20 IMAGING — CT CT CARDIAC CORONARY ARTERY CALCIUM SCORE
3 series · 14 of 20 positions shown, 16 images · non-contrast
Comparison: None.

Addendum:
:
Cardiovascular Disease Risk stratification

EXAM:
Coronary Calcium Score
TECHNIQUE: A gated, non-contrast computed tomography scan of the heart was
performed using 3 mm slice thickness. Axial images were analyzed on
a dedicated workstation. Calcium scoring of the coronary arteries
was performed using the Agatston method.

[Series 2: cascseq 2.0 sa36 70% (id) · axial · 0.39mm/px · z∈[-215,-125]mm · 4 of 76 slices shown]
[im 16/76  vessel]
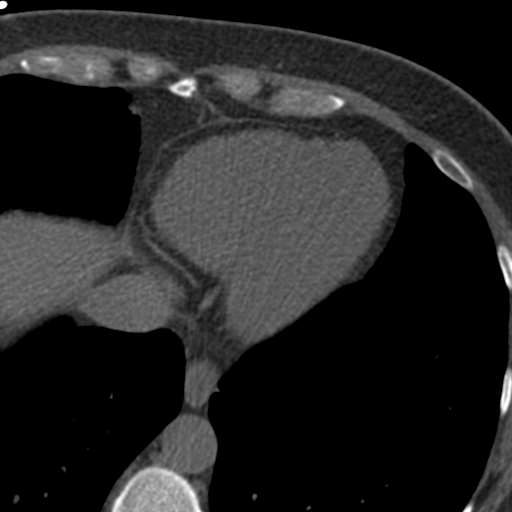
[im 31/76  vessel]
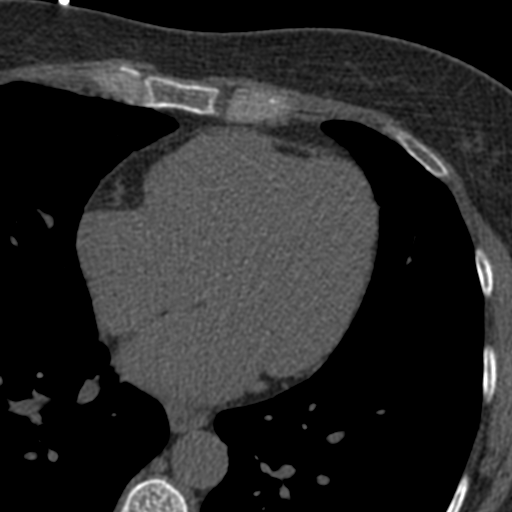
[im 46/76  vessel]
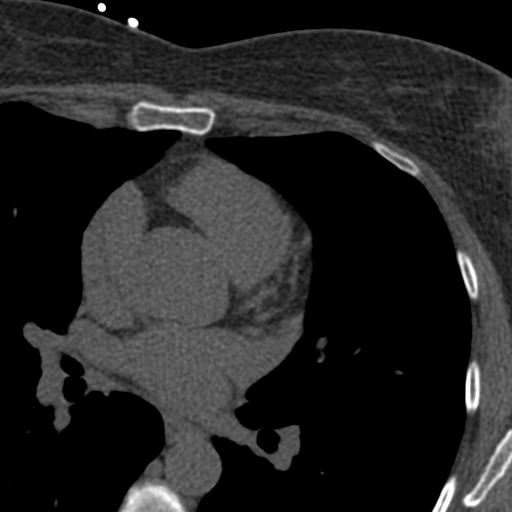
[im 61/76  vessel]
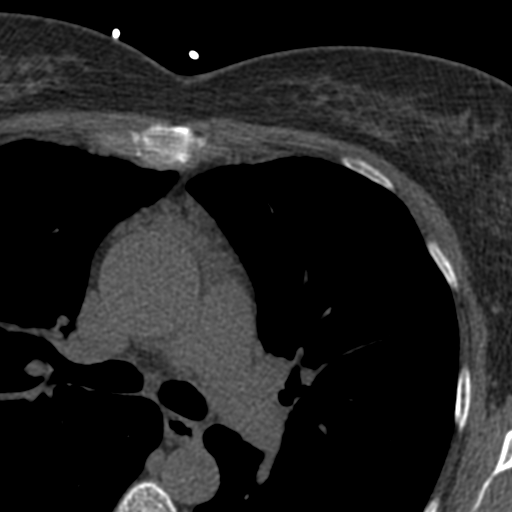

[Series 3: cascseq 2.0 bf37 st · axial · 0.70mm/px · z∈[-221,-121]mm · 5 of 76 slices shown, 7 images]
[im 13/76  vessel]
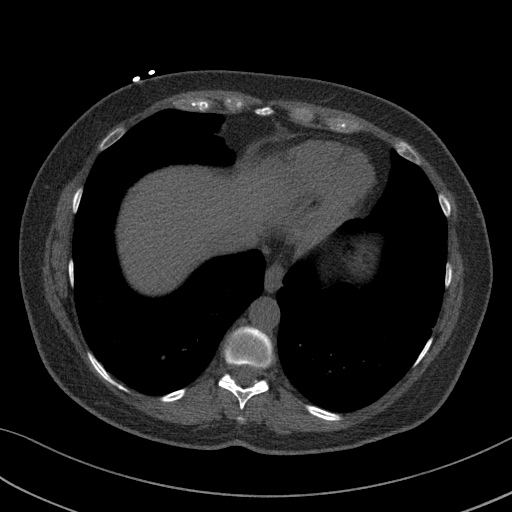
[im 13/76  lung]
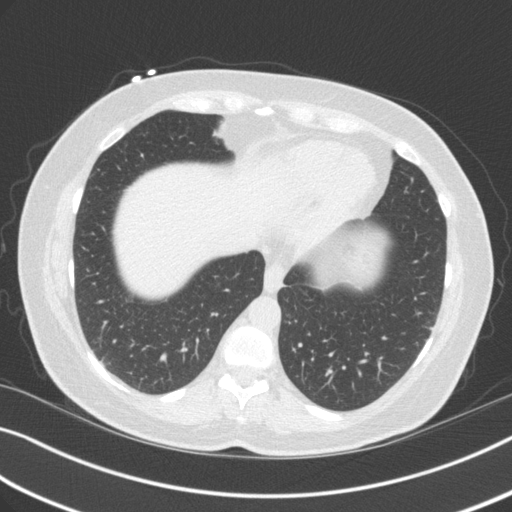
[im 26/76  vessel]
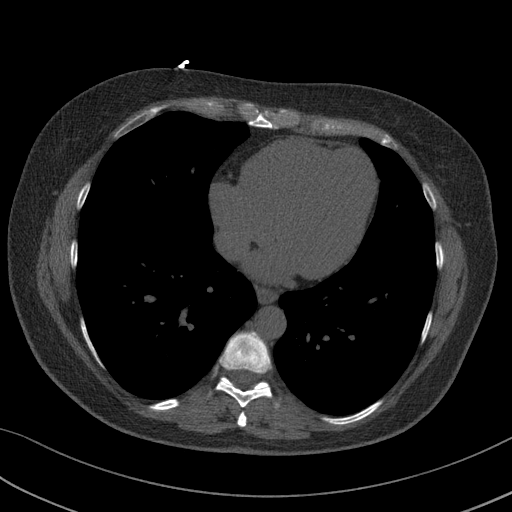
[im 38/76  vessel]
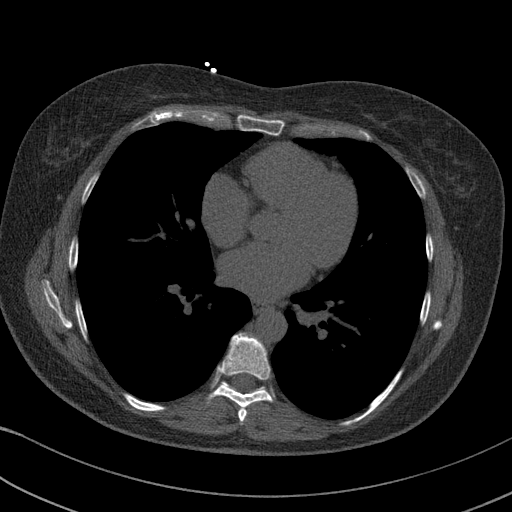
[im 51/76  vessel]
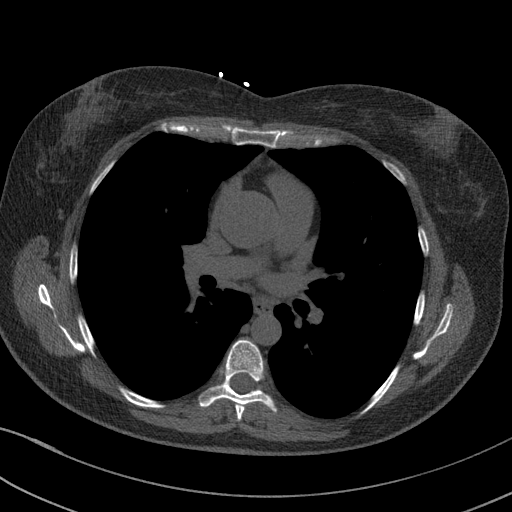
[im 63/76  vessel]
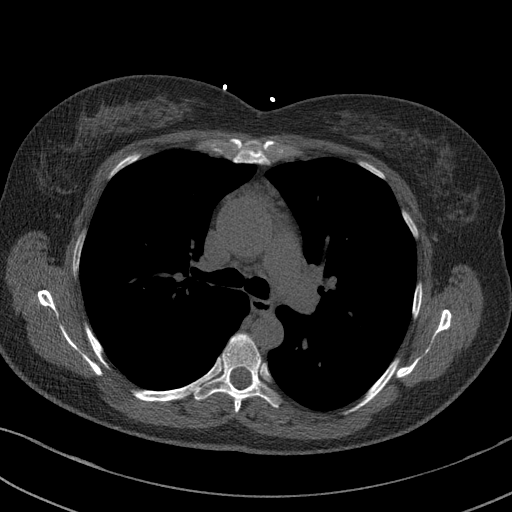
[im 63/76  lung]
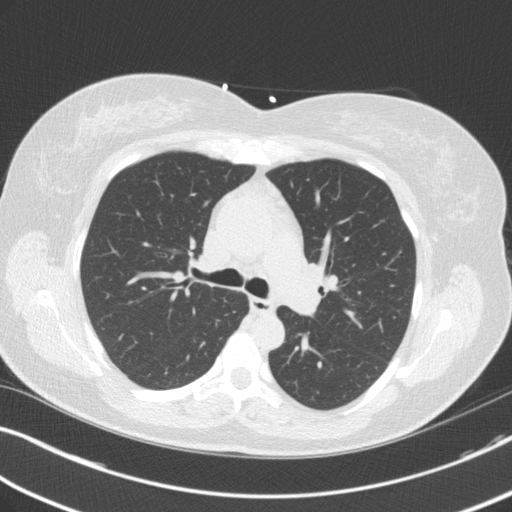

[Series 4: cascseq 2.0 br59 lung · axial · 0.70mm/px · z∈[-221,-121]mm · 5 of 76 slices shown]
[im 13/76  lung]
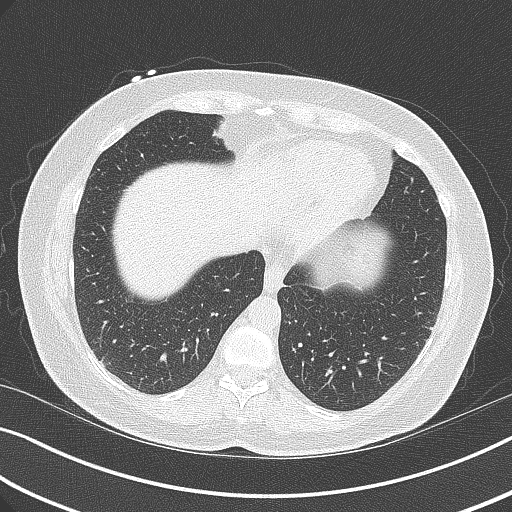
[im 26/76  lung]
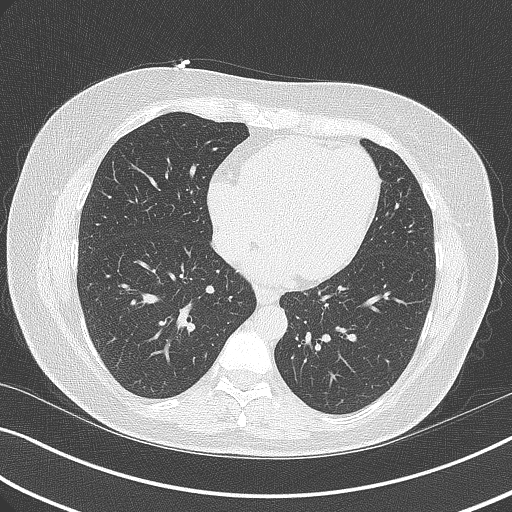
[im 38/76  lung]
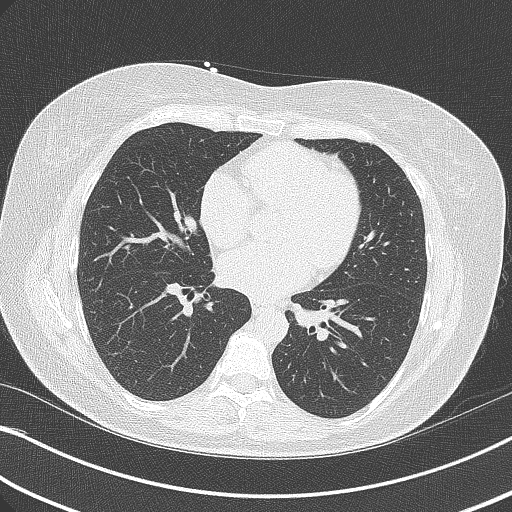
[im 51/76  lung]
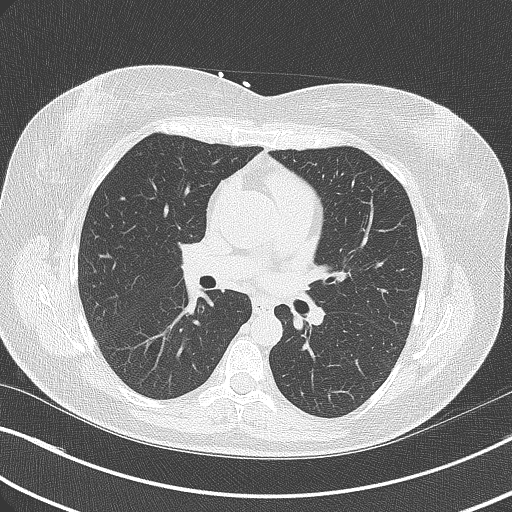
[im 63/76  lung]
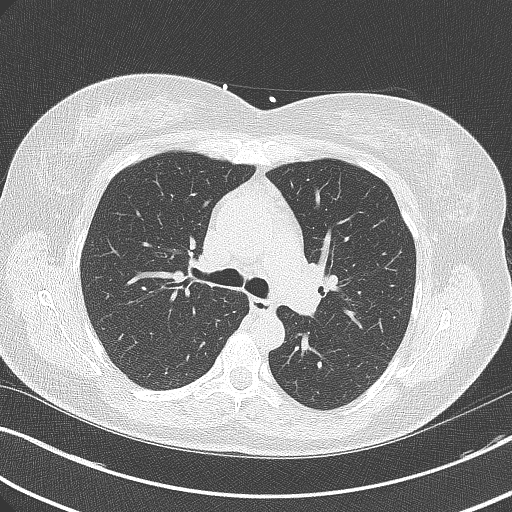

[14 of 20 positions shown; findings below may reference images not displayed]

FINDINGS: Coronary arteries: Normal origins.

Coronary Calcium Score:

Left main: 0

Left anterior descending artery: 0

Left circumflex artery: 0

Right coronary artery: 0

Total: 0

Percentile: 0

Pericardium: Normal.

Ascending Aorta: Proximal ascending aorta is mildly dilated (38 mm).

Non-cardiac: See separate report from [REDACTED].
IMPRESSION: Coronary calcium score of 0. This was 0 percentile for age-, race-,
and sex-matched controls.



If CAC=0, it is reasonable to withhold statin therapy and reassess
in 5 to 10 years, as long as higher risk conditions are absent
(diabetes mellitus, family history of premature CHD in first degree
relatives (males <55 years; females <65 years), cigarette smoking,
or LDL >=190 mg/dL).

If CAC is 1 to 99, it is reasonable to initiate statin therapy for
patients >=55 years of age.

If CAC is >=100 or >=75th percentile, it is reasonable to initiate
statin therapy at any age.

Cardiology referral should be considered for patients with CAC
scores >=400 or >=75th percentile.

*6166 AHA/ACC/AACVPR/AAPA/ABC/VIRKA/FERMINA/GAD PENIEL/Arsi/MELECIO/ITZ EMMY/BIANCA
Guideline on the Management of Blood Cholesterol: A Report of the
American College of Cardiology/American Heart Association Task Force
on Clinical Practice Guidelines. J Am Coll Cardiol.
4203;73(24):9308-9952.

Adsinar Senhouse, DO [REDACTED]

EXAM:
OVER-READ INTERPRETATION  CT CHEST

The following report is an over-read performed by radiologist Dr.
Dmitriy Capote [REDACTED] on 03/08/2021. This over-read
does not include interpretation of cardiac or coronary anatomy or
pathology. The calcium score interpretation by the cardiologist is
attached.
FINDINGS: Vascular: upper normal ascending aortic caliber, including at 3.8 cm
on [DATE].

Mediastinum/Nodes: No imaged thoracic adenopathy. Suspect minimal
residual thymic tissue in the anterior mediastinum.

Lungs/Pleura: No pleural fluid.  Clear imaged lungs.

Upper Abdomen: Normal imaged portions of the liver, spleen, stomach.

Musculoskeletal: No acute osseous abnormality.
IMPRESSION: No acute findings in the imaged extracardiac chest.

*** End of Addendum ***
:
Cardiovascular Disease Risk stratification

EXAM:
Coronary Calcium Score
FINDINGS: Coronary arteries: Normal origins.

Coronary Calcium Score:

Left main: 0

Left anterior descending artery: 0

Left circumflex artery: 0

Right coronary artery: 0

Total: 0

Percentile: 0

Pericardium: Normal.

Ascending Aorta: Proximal ascending aorta is mildly dilated (38 mm).

Non-cardiac: See separate report from [REDACTED].
IMPRESSION: Coronary calcium score of 0. This was 0 percentile for age-, race-,
and sex-matched controls.



If CAC=0, it is reasonable to withhold statin therapy and reassess
in 5 to 10 years, as long as higher risk conditions are absent
(diabetes mellitus, family history of premature CHD in first degree
relatives (males <55 years; females <65 years), cigarette smoking,
or LDL >=190 mg/dL).

If CAC is 1 to 99, it is reasonable to initiate statin therapy for
patients >=55 years of age.

If CAC is >=100 or >=75th percentile, it is reasonable to initiate
statin therapy at any age.

Cardiology referral should be considered for patients with CAC
scores >=400 or >=75th percentile.

*6166 AHA/ACC/AACVPR/AAPA/ABC/VIRKA/FERMINA/GAD PENIEL/Arsi/MELECIO/ITZ EMMY/BIANCA
Guideline on the Management of Blood Cholesterol: A Report of the
American College of Cardiology/American Heart Association Task Force
on Clinical Practice Guidelines. J Am Coll Cardiol.
4203;73(24):9308-9952.

Adsinar Senhouse, DO [REDACTED]

## 2023-08-19 DIAGNOSIS — E782 Mixed hyperlipidemia: Secondary | ICD-10-CM | POA: Diagnosis not present

## 2023-08-19 DIAGNOSIS — R7303 Prediabetes: Secondary | ICD-10-CM | POA: Diagnosis not present

## 2023-08-19 DIAGNOSIS — E559 Vitamin D deficiency, unspecified: Secondary | ICD-10-CM | POA: Diagnosis not present

## 2023-08-19 DIAGNOSIS — E669 Obesity, unspecified: Secondary | ICD-10-CM | POA: Diagnosis not present

## 2023-08-19 DIAGNOSIS — E039 Hypothyroidism, unspecified: Secondary | ICD-10-CM | POA: Diagnosis not present

## 2023-08-20 ENCOUNTER — Other Ambulatory Visit (HOSPITAL_COMMUNITY): Payer: Self-pay

## 2023-08-20 MED ORDER — PHENTERMINE HCL 15 MG PO CAPS
15.0000 mg | ORAL_CAPSULE | Freq: Every day | ORAL | 1 refills | Status: DC
  Filled 2023-08-20: qty 30, 30d supply, fill #0
  Filled 2023-09-18: qty 30, 30d supply, fill #1

## 2023-08-26 ENCOUNTER — Other Ambulatory Visit (HOSPITAL_COMMUNITY): Payer: Self-pay

## 2023-09-10 ENCOUNTER — Other Ambulatory Visit (HOSPITAL_COMMUNITY): Payer: Self-pay

## 2023-09-10 ENCOUNTER — Other Ambulatory Visit: Payer: Self-pay | Admitting: Internal Medicine

## 2023-09-10 MED ORDER — PRAVASTATIN SODIUM 10 MG PO TABS
10.0000 mg | ORAL_TABLET | Freq: Every day | ORAL | 1 refills | Status: DC
  Filled 2023-09-10: qty 90, 90d supply, fill #0
  Filled 2023-12-05: qty 90, 90d supply, fill #1

## 2023-09-19 ENCOUNTER — Other Ambulatory Visit: Payer: Self-pay

## 2023-09-19 ENCOUNTER — Other Ambulatory Visit (HOSPITAL_COMMUNITY): Payer: Self-pay

## 2023-11-04 ENCOUNTER — Other Ambulatory Visit (HOSPITAL_COMMUNITY): Payer: Self-pay

## 2023-11-21 ENCOUNTER — Other Ambulatory Visit (HOSPITAL_COMMUNITY): Payer: Self-pay

## 2023-11-21 ENCOUNTER — Other Ambulatory Visit: Payer: Self-pay

## 2023-11-24 ENCOUNTER — Other Ambulatory Visit (HOSPITAL_COMMUNITY): Payer: Self-pay

## 2023-11-24 ENCOUNTER — Encounter (HOSPITAL_COMMUNITY): Payer: Self-pay

## 2023-11-24 MED ORDER — PHENTERMINE HCL 15 MG PO CAPS
15.0000 mg | ORAL_CAPSULE | Freq: Every day | ORAL | 1 refills | Status: AC
  Filled 2023-11-24: qty 30, 30d supply, fill #0

## 2023-11-25 ENCOUNTER — Other Ambulatory Visit (HOSPITAL_COMMUNITY): Payer: Self-pay

## 2023-11-26 DIAGNOSIS — Z124 Encounter for screening for malignant neoplasm of cervix: Secondary | ICD-10-CM | POA: Diagnosis not present

## 2023-11-26 DIAGNOSIS — Z1231 Encounter for screening mammogram for malignant neoplasm of breast: Secondary | ICD-10-CM | POA: Diagnosis not present

## 2023-11-26 DIAGNOSIS — Z13820 Encounter for screening for osteoporosis: Secondary | ICD-10-CM | POA: Diagnosis not present

## 2023-11-26 DIAGNOSIS — Z1151 Encounter for screening for human papillomavirus (HPV): Secondary | ICD-10-CM | POA: Diagnosis not present

## 2023-11-26 DIAGNOSIS — Z6831 Body mass index (BMI) 31.0-31.9, adult: Secondary | ICD-10-CM | POA: Diagnosis not present

## 2023-11-26 DIAGNOSIS — Z01419 Encounter for gynecological examination (general) (routine) without abnormal findings: Secondary | ICD-10-CM | POA: Diagnosis not present

## 2023-12-05 ENCOUNTER — Other Ambulatory Visit (HOSPITAL_COMMUNITY): Payer: Self-pay

## 2023-12-30 DIAGNOSIS — E782 Mixed hyperlipidemia: Secondary | ICD-10-CM | POA: Diagnosis not present

## 2023-12-30 DIAGNOSIS — E039 Hypothyroidism, unspecified: Secondary | ICD-10-CM | POA: Diagnosis not present

## 2023-12-30 DIAGNOSIS — E559 Vitamin D deficiency, unspecified: Secondary | ICD-10-CM | POA: Diagnosis not present

## 2023-12-30 DIAGNOSIS — R7303 Prediabetes: Secondary | ICD-10-CM | POA: Diagnosis not present

## 2023-12-30 DIAGNOSIS — M62838 Other muscle spasm: Secondary | ICD-10-CM | POA: Diagnosis not present

## 2024-01-02 ENCOUNTER — Other Ambulatory Visit (HOSPITAL_COMMUNITY): Payer: Self-pay

## 2024-01-02 MED ORDER — PHENTERMINE HCL 30 MG PO CAPS
30.0000 mg | ORAL_CAPSULE | Freq: Every day | ORAL | 1 refills | Status: AC
  Filled 2024-01-02: qty 30, 30d supply, fill #0
  Filled 2024-03-01: qty 30, 30d supply, fill #1

## 2024-02-24 ENCOUNTER — Other Ambulatory Visit (HOSPITAL_COMMUNITY): Payer: Self-pay

## 2024-03-01 ENCOUNTER — Other Ambulatory Visit: Payer: Self-pay

## 2024-03-01 ENCOUNTER — Other Ambulatory Visit (HOSPITAL_COMMUNITY): Payer: Self-pay

## 2024-03-01 ENCOUNTER — Other Ambulatory Visit: Payer: Self-pay | Admitting: Internal Medicine

## 2024-03-01 MED ORDER — PRAVASTATIN SODIUM 10 MG PO TABS
10.0000 mg | ORAL_TABLET | Freq: Every day | ORAL | 1 refills | Status: AC
  Filled 2024-03-01: qty 90, 90d supply, fill #0

## 2024-03-01 MED ORDER — SYNTHROID 100 MCG PO TABS
100.0000 ug | ORAL_TABLET | Freq: Every day | ORAL | 1 refills | Status: AC
  Filled 2024-03-01: qty 90, 90d supply, fill #0

## 2024-03-02 ENCOUNTER — Other Ambulatory Visit: Payer: Self-pay

## 2024-03-03 ENCOUNTER — Other Ambulatory Visit: Payer: Self-pay

## 2024-06-29 ENCOUNTER — Other Ambulatory Visit: Payer: Self-pay

## 2024-07-02 ENCOUNTER — Encounter: Payer: Self-pay | Admitting: Internal Medicine
